# Patient Record
Sex: Female | Born: 1985
Health system: Southern US, Community
[De-identification: ages and names within clinical notes are randomized; demographics above are authoritative.]

## PROBLEM LIST (undated history)

## (undated) ENCOUNTER — Inpatient Hospital Stay (HOSPITAL_COMMUNITY): Payer: Self-pay

## (undated) DIAGNOSIS — Z8709 Personal history of other diseases of the respiratory system: Secondary | ICD-10-CM

## (undated) DIAGNOSIS — Z87440 Personal history of urinary (tract) infections: Secondary | ICD-10-CM

## (undated) DIAGNOSIS — Z789 Other specified health status: Secondary | ICD-10-CM

## (undated) DIAGNOSIS — Z87898 Personal history of other specified conditions: Secondary | ICD-10-CM

## (undated) DIAGNOSIS — Z9189 Other specified personal risk factors, not elsewhere classified: Secondary | ICD-10-CM

## (undated) DIAGNOSIS — Z8619 Personal history of other infectious and parasitic diseases: Secondary | ICD-10-CM

## (undated) DIAGNOSIS — Z8669 Personal history of other diseases of the nervous system and sense organs: Secondary | ICD-10-CM

## (undated) HISTORY — DX: Personal history of other diseases of the respiratory system: Z87.09

## (undated) HISTORY — DX: Personal history of other infectious and parasitic diseases: Z86.19

## (undated) HISTORY — DX: Personal history of other specified conditions: Z87.898

## (undated) HISTORY — DX: Personal history of other diseases of the nervous system and sense organs: Z86.69

## (undated) HISTORY — PX: NO PAST SURGERIES: SHX2092

## (undated) HISTORY — DX: Personal history of urinary (tract) infections: Z87.440

## (undated) HISTORY — DX: Other specified personal risk factors, not elsewhere classified: Z91.89

---
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
## 2005-06-19 ENCOUNTER — Ambulatory Visit: Payer: Self-pay | Admitting: Family Medicine

## 2006-06-27 ENCOUNTER — Emergency Department (HOSPITAL_COMMUNITY): Admission: EM | Admit: 2006-06-27 | Discharge: 2006-06-27 | Payer: Self-pay | Admitting: Emergency Medicine

## 2006-07-02 ENCOUNTER — Ambulatory Visit: Payer: Self-pay | Admitting: Family Medicine

## 2006-11-27 ENCOUNTER — Ambulatory Visit: Payer: Self-pay | Admitting: Family Medicine

## 2006-12-16 ENCOUNTER — Ambulatory Visit: Payer: Self-pay | Admitting: Family Medicine

## 2007-06-05 ENCOUNTER — Ambulatory Visit: Payer: Self-pay | Admitting: Internal Medicine

## 2007-06-05 LAB — CONVERTED CEMR LAB
Bilirubin Urine: NEGATIVE
Blood in Urine, dipstick: NEGATIVE
Glucose, Urine, Semiquant: NEGATIVE
Ketones, urine, test strip: NEGATIVE
Nitrite: NEGATIVE
Protein, U semiquant: NEGATIVE
Specific Gravity, Urine: 1.025
Urobilinogen, UA: NEGATIVE
WBC Urine, dipstick: NEGATIVE
pH: 6

## 2007-08-21 ENCOUNTER — Ambulatory Visit: Payer: Self-pay | Admitting: Family Medicine

## 2007-08-21 DIAGNOSIS — R109 Unspecified abdominal pain: Secondary | ICD-10-CM | POA: Insufficient documentation

## 2007-08-21 LAB — CONVERTED CEMR LAB
Bilirubin Urine: NEGATIVE
Blood in Urine, dipstick: NEGATIVE
Glucose, Urine, Semiquant: NEGATIVE
Ketones, urine, test strip: NEGATIVE
Nitrite: NEGATIVE
Protein, U semiquant: NEGATIVE
Specific Gravity, Urine: 1.01
Urobilinogen, UA: NEGATIVE
WBC Urine, dipstick: NEGATIVE
pH: 7.5

## 2009-11-21 ENCOUNTER — Telehealth: Payer: Self-pay | Admitting: Family Medicine

## 2010-10-24 ENCOUNTER — Inpatient Hospital Stay (HOSPITAL_COMMUNITY)
Admission: AD | Admit: 2010-10-24 | Discharge: 2010-10-27 | Payer: Self-pay | Source: Home / Self Care | Attending: Obstetrics and Gynecology | Admitting: Obstetrics and Gynecology

## 2010-11-28 NOTE — Progress Notes (Signed)
Summary: abdomianl pain  Phone Note Call from Patient Call back at [phone]   Caller: Patient Call For: Dr Milinda Antis Summary of Call:  Pt left v/m on triage phone. Pt has appointment to see Dr.Tower on Wed 11/23/09 at 4pm. Pt has abdominal pain especially after eats. I called pt for further details and left message on pt's v/m to call me back. Initial call taken by: Lewanda Rife LPN,  November 21, 2009 12:41 PM  Follow-up for Phone Call        I tried pt again and got v/m I left message for pt to please call back. Because of the time of day I will go ahead and send phone note to Dr. Milinda Antis.Lewanda Rife LPN  November 21, 2009 3:40 PM   keep appt with me  if severe abd pain- or high fever or dehydration after hours- go to ER Follow-up by: Judith Part MD,  November 21, 2009 4:28 PM  Additional Follow-up for Phone Call Additional follow up Details #1::        Advised pt. Additional Follow-up by: Lowella Petties CMA,  November 21, 2009 5:06 PM

## 2011-01-08 LAB — CBC
HCT: 24.6 % — ABNORMAL LOW (ref 36.0–46.0)
HCT: 33.3 % — ABNORMAL LOW (ref 36.0–46.0)
Hemoglobin: 11.7 g/dL — ABNORMAL LOW (ref 12.0–15.0)
Hemoglobin: 8.5 g/dL — ABNORMAL LOW (ref 12.0–15.0)
MCH: 29.5 pg (ref 26.0–34.0)
MCH: 29.8 pg (ref 26.0–34.0)
MCHC: 34.6 g/dL (ref 30.0–36.0)
MCHC: 35.1 g/dL (ref 30.0–36.0)
MCV: 84.9 fL (ref 78.0–100.0)
MCV: 85.4 fL (ref 78.0–100.0)
Platelets: 210 10*3/uL (ref 150–400)
Platelets: 281 10*3/uL (ref 150–400)
RBC: 2.88 MIL/uL — ABNORMAL LOW (ref 3.87–5.11)
RBC: 3.92 MIL/uL (ref 3.87–5.11)
RDW: 13.7 % (ref 11.5–15.5)
RDW: 13.9 % (ref 11.5–15.5)
WBC: 14.5 10*3/uL — ABNORMAL HIGH (ref 4.0–10.5)
WBC: 9.2 10*3/uL (ref 4.0–10.5)

## 2011-01-08 LAB — RPR: RPR Ser Ql: NONREACTIVE

## 2011-08-27 ENCOUNTER — Other Ambulatory Visit: Payer: Self-pay | Admitting: Gastroenterology

## 2011-10-30 NOTE — L&D Delivery Note (Signed)
After amniotomy the pt rapidly completed the first stage. She pushed for 30 minutes and had a SVD of one live viable white female infant over a second degree midline tear in the ROA position. Placenta-S/I. Tear closed with 3-0 chromic. Baby to NBN. EBL-400cc/

## 2012-02-04 ENCOUNTER — Encounter (HOSPITAL_COMMUNITY): Payer: Self-pay

## 2012-02-04 ENCOUNTER — Inpatient Hospital Stay (HOSPITAL_COMMUNITY)
Admission: AD | Admit: 2012-02-04 | Discharge: 2012-02-05 | Disposition: A | Discharge status: Home / Self Care | Payer: BC Managed Care – PPO | Source: Ambulatory Visit | Attending: Obstetrics and Gynecology | Admitting: Obstetrics and Gynecology

## 2012-02-04 DIAGNOSIS — O210 Mild hyperemesis gravidarum: Secondary | ICD-10-CM

## 2012-02-04 HISTORY — DX: Other specified health status: Z78.9

## 2012-02-04 LAB — URINALYSIS, ROUTINE W REFLEX MICROSCOPIC
Glucose, UA: NEGATIVE mg/dL
Hgb urine dipstick: NEGATIVE
Ketones, ur: >80 mg/dL — AB
Leukocytes, UA: NEGATIVE
Nitrite: NEGATIVE
Protein, ur: NEGATIVE mg/dL
Specific Gravity, Urine: >1.03 — ABNORMAL HIGH (ref 1.005–1.030)
Urobilinogen, UA: 1 mg/dL (ref 0.0–1.0)
pH: 6 (ref 5.0–8.0)

## 2012-02-04 LAB — POCT PREGNANCY, URINE: Preg Test, Ur: POSITIVE — AB

## 2012-02-04 LAB — COMPREHENSIVE METABOLIC PANEL
Alkaline Phosphatase: 45 U/L (ref 39–117)
BUN: 13 mg/dL (ref 6–23)
Chloride: 100 mEq/L (ref 96–112)
GFR calc Af Amer: >90 mL/min (ref >90)
Glucose, Bld: 80 mg/dL (ref 70–99)
Potassium: 3.8 mEq/L (ref 3.5–5.1)
Total Bilirubin: 0.4 mg/dL (ref 0.3–1.2)
Total Protein: 6.7 g/dL (ref 6.0–8.3)

## 2012-02-04 MED ORDER — PROMETHAZINE HCL 25 MG/ML IJ SOLN
12.5000 mg | Freq: Once | INTRAMUSCULAR | Status: AC
  Administered 2012-02-04: 12.5 mg via INTRAVENOUS
  Filled 2012-02-04: qty 1

## 2012-02-04 MED ORDER — FAMOTIDINE IN NACL 20-0.9 MG/50ML-% IV SOLN
20.0000 mg | Freq: Once | INTRAVENOUS | Status: AC
  Administered 2012-02-04: 20 mg via INTRAVENOUS
  Filled 2012-02-04: qty 50

## 2012-02-04 MED ORDER — ONDANSETRON HCL 4 MG/2ML IJ SOLN
4.0000 mg | Freq: Once | INTRAMUSCULAR | Status: AC
  Administered 2012-02-04: 4 mg via INTRAVENOUS
  Filled 2012-02-04: qty 2

## 2012-02-04 MED ORDER — THIAMINE HCL 100 MG/ML IJ SOLN
Freq: Once | INTRAVENOUS | Status: AC
  Administered 2012-02-04: 22:00:00 via INTRAVENOUS
  Filled 2012-02-04: qty 1000

## 2012-02-04 MED ORDER — LACTATED RINGERS IV BOLUS (SEPSIS)
1000.0000 mL | Freq: Once | INTRAVENOUS | Status: AC
  Administered 2012-02-04: 1000 mL via INTRAVENOUS

## 2012-02-04 NOTE — MAU Provider Note (Signed)
History     CSN: [id number]  Arrival date & time 02/04/12  1959   None     Chief Complaint  Patient presents with  . Dehydration  . Emesis During Pregnancy   HPI Bridget Walters is a 26 y.o. female @ [redacted]w[redacted]d  gestation who presents to MAU for persistent nausea and vomiting. Onset of nausea 3 weeks ago. Called the office and given instructions for diet with nausea/vomiting.  Unable to keep anything down for the past 3 days. Has been taking Reglan, Phenergan, Zofran and Vitamin B6 without relief. Weight loss of 5 pounds over the past few days. Headache and palpations. The history was provided by the patient.  No past medical history on file.  No past surgical history on file.  No family history on file.  History  Substance Use Topics  . Smoking status: Not on file  . Smokeless tobacco: Not on file  . Alcohol Use: Not on file    OB History    No data available      Review of Systems  Constitutional: Positive for chills and appetite change. Negative for fever, diaphoresis and fatigue.  HENT: Negative for ear pain, congestion, sore throat, facial swelling, neck pain, neck stiffness, dental problem and sinus pressure.   Eyes: Negative for photophobia, pain and discharge.  Respiratory: Negative for cough, chest tightness and wheezing.   Cardiovascular: Positive for palpitations.  Gastrointestinal: Positive for nausea and rectal pain. Negative for vomiting, abdominal pain (epigastric discomfort with vomiting), diarrhea, constipation and abdominal distention.  Genitourinary: Negative for dysuria, urgency, frequency, flank pain, vaginal bleeding, vaginal discharge and difficulty urinating.  Musculoskeletal: Negative for myalgias, back pain and gait problem.  Skin: Negative for color change and rash.  Neurological: Positive for dizziness, light-headedness and headaches. Negative for speech difficulty, weakness and numbness.  Psychiatric/Behavioral: Negative for confusion and agitation. The  patient is not nervous/anxious.     Allergies  Minocycline hcl and Sumatriptan  Home Medications  No current outpatient prescriptions on file.  There were no vitals taken for this visit.  Physical Exam  Nursing note and vitals reviewed. Constitutional: She is oriented to person, place, and time. She appears well-developed and well-nourished. No distress.  HENT:  Head: Normocephalic.  Eyes: EOM are normal.  Neck: Neck supple.  Pulmonary/Chest: Effort normal.  Abdominal: Soft. There is tenderness in the epigastric area.  Musculoskeletal: Normal range of motion.  Neurological: She is alert and oriented to person, place, and time. No cranial nerve deficit.  Skin: Skin is warm and dry.  Psychiatric: She has a normal mood and affect. Her behavior is normal. Judgment and thought content normal.   Results for orders placed during the hospital encounter of 02/04/12 (from the past 24 hour(s))  URINALYSIS, ROUTINE W REFLEX MICROSCOPIC     Status: Abnormal   Collection Time   02/04/12  8:20 PM      Component Value Range   Color, Urine YELLOW  YELLOW    APPearance CLEAR  CLEAR    Specific Gravity, Urine >1.030 (*) 1.005 - 1.030    pH 6.0  5.0 - 8.0    Glucose, UA NEGATIVE  NEGATIVE (mg/dL)   Hgb urine dipstick NEGATIVE  NEGATIVE    Bilirubin Urine SMALL (*) NEGATIVE    Ketones, ur >80 (*) NEGATIVE (mg/dL)   Protein, ur NEGATIVE  NEGATIVE (mg/dL)   Urobilinogen, UA 1.0  0.0 - 1.0 (mg/dL)   Nitrite NEGATIVE  NEGATIVE    Leukocytes, UA NEGATIVE  NEGATIVE   POCT PREGNANCY, URINE     Status: Abnormal   Collection Time   02/04/12  8:27 PM      Component Value Range   Preg Test, Ur POSITIVE (*) NEGATIVE   COMPREHENSIVE METABOLIC PANEL     Status: Normal   Collection Time   02/04/12  8:55 PM      Component Value Range   Sodium 135  135 - 145 (mEq/L)   Potassium 3.8  3.5 - 5.1 (mEq/L)   Chloride 100  96 - 112 (mEq/L)   CO2 23  19 - 32 (mEq/L)   Glucose, Bld 80  70 - 99 (mg/dL)   BUN 13  6  - 23 (mg/dL)   Creatinine, Ser 1.61  0.50 - 1.10 (mg/dL)   Calcium 9.6  8.4 - 09.6 (mg/dL)   Total Protein 6.7  6.0 - 8.3 (g/dL)   Albumin 3.8  3.5 - 5.2 (g/dL)   AST 12  0 - 37 (U/L)   ALT 9  0 - 35 (U/L)   Alkaline Phosphatase 45  39 - 117 (U/L)   Total Bilirubin 0.4  0.3 - 1.2 (mg/dL)   GFR calc non Af Amer >90  >90 (mL/min)   GFR calc Af Amer >90  >90 (mL/min)   Assessment: Hyperemesis gravidarum   First trimester pregnancy  Plan:  IV hydration   Zofran   Phenergan   D/c home with phenergan supp. Rx   Zofran ODT Rx   Follow up as scheduled in the office.    Informal bedside ultrasound, active IUP with cardiac activity, patient able to visualize heart beat.  ED Course: 21:20 discussed with Dr. Claiborne Billings and she agrees with plan of care.   Procedures   MDM: patient feeling much better after IV hydration and antiemetics. Taking po fluids and eating saltine crackers.

## 2012-02-04 NOTE — MAU Note (Signed)
Patient is here with c/o 9 days of n/v. She states that the left over anti-emetic that she has at home are not working. She denies any vaginal bleeding or discharge.

## 2012-02-05 MED ORDER — ONDANSETRON 8 MG PO TBDP
8.0000 mg | ORAL_TABLET | Freq: Three times a day (TID) | ORAL | Status: AC | PRN
Start: 2012-02-05 — End: 2012-02-12

## 2012-02-05 MED ORDER — PROMETHAZINE HCL 25 MG RE SUPP: 12.5000 mg | Freq: Four times a day (QID) | RECTAL | Status: DC | PRN

## 2012-02-05 NOTE — Discharge Instructions (Signed)
Hyperemesis Gravidarum Diet Hyperemesis gravidarum is a severe form of morning sickness. It is characterized by frequent and severe vomiting. It happens during the first trimester of pregnancy. It may be caused by the rapid hormone changes that happen during pregnancy. It is associated with a 5% weight loss of pre-pregnancy weight. The hyperemesis diet may be used to lessen symptoms of nausea and vomiting. EATING GUIDELINES  Eat 5 to 6 small meals daily instead of 3 large meals.   Avoid foods with strong smells.   Avoid drinking 30 minutes before and after meals.   Avoid fried or high-fat foods, such as butter and cream sauces.   Starchy foods are usually well-tolerated, such as cereal, toast, bread, potatoes, pasta, Derenzo, and pretzels.   Eat crackers before you get out of bed in the morning.   Avoid spicy foods.   Ginger may help with nausea. Add  tsp ginger to hot tea or choose ginger tea.   Continue to take your prenatal vitamins as directed by your caregiver.  SAMPLE MEAL PLAN Breakfast    cup oatmeal   1 slice toast   1 tsp heart-healthy margarine   1 tsp jelly   1 scrambled egg  Midmorning Snack   1 cup low-fat yogurt  Lunch   Plain ham sandwich   Carrot or celery sticks   1 small apple   3 graham crackers  Midafternoon Snack   Cheese and crackers  Dinner  4 oz pork tenderloin   1 small baked potato   1 tsp margarine    cup broccoli    cup grapes  Evening Snack  1 cup pudding  Document Released: 08/12/2007 Document Revised: 10/04/2011 Document Reviewed: 02/09/2011 [employer] Patient Information 2012 [employer].Hyperemesis Gravidarum Diet Hyperemesis gravidarum is a severe form of morning sickness. It is characterized by frequent and severe vomiting. It happens during the first trimester of pregnancy. It may be caused by the rapid hormone changes that happen during pregnancy. It is associated with a 5% weight loss of pre-pregnancy weight.  The hyperemesis diet may be used to lessen symptoms of nausea and vomiting. EATING GUIDELINES  Eat 5 to 6 small meals daily instead of 3 large meals.   Avoid foods with strong smells.   Avoid drinking 30 minutes before and after meals.   Avoid fried or high-fat foods, such as butter and cream sauces.   Starchy foods are usually well-tolerated, such as cereal, toast, bread, potatoes, pasta, Doster, and pretzels.   Eat crackers before you get out of bed in the morning.   Avoid spicy foods.   Ginger may help with nausea. Add  tsp ginger to hot tea or choose ginger tea.   Continue to take your prenatal vitamins as directed by your caregiver.  SAMPLE MEAL PLAN Breakfast    cup oatmeal   1 slice toast   1 tsp heart-healthy margarine   1 tsp jelly   1 scrambled egg  Midmorning Snack   1 cup low-fat yogurt  Lunch   Plain ham sandwich   Carrot or celery sticks   1 small apple   3 graham crackers  Midafternoon Snack   Cheese and crackers  Dinner  4 oz pork tenderloin   1 small baked potato   1 tsp margarine    cup broccoli    cup grapes  Evening Snack  1 cup pudding  Document Released: 08/12/2007 Document Revised: 10/04/2011 Document Reviewed: 02/09/2011 [employer] Patient Information 2012 [employer]. 

## 2012-09-01 ENCOUNTER — Encounter (HOSPITAL_COMMUNITY): Payer: Self-pay

## 2012-09-01 ENCOUNTER — Inpatient Hospital Stay (HOSPITAL_COMMUNITY)
Admission: AD | Admit: 2012-09-01 | Discharge: 2012-09-01 | Disposition: A | Discharge status: Home / Self Care | Payer: BC Managed Care – PPO | Source: Ambulatory Visit | Attending: Obstetrics and Gynecology | Admitting: Obstetrics and Gynecology

## 2012-09-01 DIAGNOSIS — O479 False labor, unspecified: Secondary | ICD-10-CM | POA: Insufficient documentation

## 2012-09-01 NOTE — MAU Note (Signed)
Contractions every 5 minutes since 7pm Sunday evening. Denies leaking of fluid or vaginal bleeding. Positive fetal movement. Was dilated 2cm in office 2 weeks ago.

## 2012-09-04 ENCOUNTER — Encounter (HOSPITAL_COMMUNITY): Payer: Self-pay | Admitting: Anesthesiology

## 2012-09-04 ENCOUNTER — Inpatient Hospital Stay (HOSPITAL_COMMUNITY): Payer: BC Managed Care – PPO | Admitting: Anesthesiology

## 2012-09-04 ENCOUNTER — Inpatient Hospital Stay (HOSPITAL_COMMUNITY)
Admission: AD | Admit: 2012-09-04 | Discharge: 2012-09-06 | DRG: 373 | Disposition: A | Discharge status: Home / Self Care | Payer: BC Managed Care – PPO | Source: Ambulatory Visit | Attending: Obstetrics and Gynecology | Admitting: Obstetrics and Gynecology

## 2012-09-04 ENCOUNTER — Encounter (HOSPITAL_COMMUNITY): Payer: Self-pay | Admitting: *Deleted

## 2012-09-04 LAB — OB RESULTS CONSOLE ABO/RH

## 2012-09-04 LAB — OB RESULTS CONSOLE HIV ANTIBODY (ROUTINE TESTING): HIV: NONREACTIVE

## 2012-09-04 LAB — CBC
MCH: 30.1 pg (ref 26.0–34.0)
MCHC: 34.7 g/dL (ref 30.0–36.0)
MCV: 86.5 fL (ref 78.0–100.0)
Platelets: 276 10*3/uL (ref 150–400)
RDW: 13.6 % (ref 11.5–15.5)

## 2012-09-04 LAB — RPR: RPR Ser Ql: NONREACTIVE

## 2012-09-04 LAB — OB RESULTS CONSOLE ANTIBODY SCREEN: Antibody Screen: NEGATIVE

## 2012-09-04 LAB — ABO/RH: ABO/RH(D): B POS

## 2012-09-04 MED ORDER — OXYCODONE-ACETAMINOPHEN 5-325 MG PO TABS
1.0000 | ORAL_TABLET | ORAL | Status: DC | PRN
  Administered 2012-09-04: 2 via ORAL
  Administered 2012-09-04: 1 via ORAL
  Filled 2012-09-04: qty 2
  Filled 2012-09-04: qty 1

## 2012-09-04 MED ORDER — ONDANSETRON HCL 4 MG/2ML IJ SOLN: 4.0000 mg | INTRAMUSCULAR | Status: DC | PRN

## 2012-09-04 MED ORDER — EPHEDRINE 5 MG/ML INJ
INTRAVENOUS | Status: AC
  Filled 2012-09-04: qty 4

## 2012-09-04 MED ORDER — DIPHENHYDRAMINE HCL 50 MG/ML IJ SOLN: 12.5000 mg | INTRAMUSCULAR | Status: DC | PRN

## 2012-09-04 MED ORDER — PENICILLIN G POTASSIUM 5000000 UNITS IJ SOLR
2.5000 10*6.[IU] | INTRAVENOUS | Status: DC
  Filled 2012-09-04: qty 2.5

## 2012-09-04 MED ORDER — ACETAMINOPHEN 325 MG PO TABS: 650.0000 mg | ORAL_TABLET | ORAL | Status: DC | PRN

## 2012-09-04 MED ORDER — ONDANSETRON HCL 4 MG PO TABS: 4.0000 mg | ORAL_TABLET | ORAL | Status: DC | PRN

## 2012-09-04 MED ORDER — TETANUS-DIPHTH-ACELL PERTUSSIS 5-2.5-18.5 LF-MCG/0.5 IM SUSP: 0.5000 mL | Freq: Once | INTRAMUSCULAR | Status: DC

## 2012-09-04 MED ORDER — SIMETHICONE 80 MG PO CHEW: 80.0000 mg | CHEWABLE_TABLET | ORAL | Status: DC | PRN

## 2012-09-04 MED ORDER — CITRIC ACID-SODIUM CITRATE 334-500 MG/5ML PO SOLN: 30.0000 mL | ORAL | Status: DC | PRN

## 2012-09-04 MED ORDER — EPHEDRINE 5 MG/ML INJ: 10.0000 mg | INTRAVENOUS | Status: DC | PRN

## 2012-09-04 MED ORDER — LACTATED RINGERS IV SOLN
500.0000 mL | Freq: Once | INTRAVENOUS | Status: AC
  Administered 2012-09-04: 500 mL via INTRAVENOUS

## 2012-09-04 MED ORDER — LIDOCAINE HCL (PF) 1 % IJ SOLN
INTRAMUSCULAR | Status: DC | PRN
  Administered 2012-09-04 (×2): 5 mL
  Administered 2012-09-04: 30 mL

## 2012-09-04 MED ORDER — PRENATAL MULTIVITAMIN CH
1.0000 | ORAL_TABLET | Freq: Every day | ORAL | Status: DC
  Administered 2012-09-04 – 2012-09-05 (×2): 1 via ORAL
  Filled 2012-09-04 (×2): qty 1

## 2012-09-04 MED ORDER — OXYTOCIN BOLUS FROM INFUSION: 500.0000 mL | INTRAVENOUS | Status: DC

## 2012-09-04 MED ORDER — OXYCODONE-ACETAMINOPHEN 5-325 MG PO TABS
1.0000 | ORAL_TABLET | ORAL | Status: DC | PRN
  Administered 2012-09-04: 2 via ORAL
  Filled 2012-09-04: qty 2

## 2012-09-04 MED ORDER — LACTATED RINGERS IV SOLN
INTRAVENOUS | Status: DC
  Administered 2012-09-04: 09:00:00 via INTRAVENOUS

## 2012-09-04 MED ORDER — FENTANYL 2.5 MCG/ML BUPIVACAINE 1/10 % EPIDURAL INFUSION (WH - ANES): 14.0000 mL/h | INTRAMUSCULAR | Status: DC

## 2012-09-04 MED ORDER — FLEET ENEMA 7-19 GM/118ML RE ENEM: 1.0000 | ENEMA | RECTAL | Status: DC | PRN

## 2012-09-04 MED ORDER — MEASLES, MUMPS & RUBELLA VAC ~~LOC~~ INJ: 0.5000 mL | INJECTION | Freq: Once | SUBCUTANEOUS | Status: DC

## 2012-09-04 MED ORDER — ONDANSETRON HCL 4 MG/2ML IJ SOLN: 4.0000 mg | Freq: Four times a day (QID) | INTRAMUSCULAR | Status: DC | PRN

## 2012-09-04 MED ORDER — PENICILLIN G POTASSIUM 5000000 UNITS IJ SOLR
5.0000 10*6.[IU] | Freq: Once | INTRAVENOUS | Status: DC
  Filled 2012-09-04: qty 5

## 2012-09-04 MED ORDER — LIDOCAINE HCL (PF) 1 % IJ SOLN
30.0000 mL | INTRAMUSCULAR | Status: DC | PRN
  Filled 2012-09-04: qty 30

## 2012-09-04 MED ORDER — BENZOCAINE-MENTHOL 20-0.5 % EX AERO
1.0000 "application " | INHALATION_SPRAY | CUTANEOUS | Status: DC | PRN
  Administered 2012-09-04: 1 via TOPICAL
  Filled 2012-09-04: qty 56

## 2012-09-04 MED ORDER — IBUPROFEN 600 MG PO TABS
600.0000 mg | ORAL_TABLET | Freq: Four times a day (QID) | ORAL | Status: DC | PRN
  Administered 2012-09-04: 600 mg via ORAL
  Filled 2012-09-04: qty 1

## 2012-09-04 MED ORDER — PHENYLEPHRINE 40 MCG/ML (10ML) SYRINGE FOR IV PUSH (FOR BLOOD PRESSURE SUPPORT): 80.0000 ug | PREFILLED_SYRINGE | INTRAVENOUS | Status: DC | PRN

## 2012-09-04 MED ORDER — FENTANYL 2.5 MCG/ML BUPIVACAINE 1/10 % EPIDURAL INFUSION (WH - ANES)
INTRAMUSCULAR | Status: AC
  Administered 2012-09-04: 14 mL/h via EPIDURAL
  Filled 2012-09-04: qty 125

## 2012-09-04 MED ORDER — OXYTOCIN 40 UNITS IN LACTATED RINGERS INFUSION - SIMPLE MED
62.5000 mL/h | INTRAVENOUS | Status: DC
  Administered 2012-09-04: 62.5 mL/h via INTRAVENOUS
  Filled 2012-09-04: qty 1000

## 2012-09-04 MED ORDER — LACTATED RINGERS IV SOLN: 500.0000 mL | INTRAVENOUS | Status: DC | PRN

## 2012-09-04 MED ORDER — PHENYLEPHRINE 40 MCG/ML (10ML) SYRINGE FOR IV PUSH (FOR BLOOD PRESSURE SUPPORT)
PREFILLED_SYRINGE | INTRAVENOUS | Status: AC
  Filled 2012-09-04: qty 5

## 2012-09-04 MED ORDER — SENNOSIDES-DOCUSATE SODIUM 8.6-50 MG PO TABS
2.0000 | ORAL_TABLET | Freq: Every evening | ORAL | Status: DC | PRN
  Administered 2012-09-04: 2 via ORAL

## 2012-09-04 MED ORDER — SODIUM CHLORIDE 0.9 % IV SOLN
2.0000 g | Freq: Once | INTRAVENOUS | Status: AC
  Administered 2012-09-04: 2 g via INTRAVENOUS
  Filled 2012-09-04: qty 2000

## 2012-09-04 MED ORDER — ZOLPIDEM TARTRATE 5 MG PO TABS
5.0000 mg | ORAL_TABLET | Freq: Every evening | ORAL | Status: DC | PRN
  Administered 2012-09-06: 5 mg via ORAL
  Filled 2012-09-04: qty 1

## 2012-09-04 MED ORDER — DIBUCAINE 1 % RE OINT: 1.0000 "application " | TOPICAL_OINTMENT | RECTAL | Status: DC | PRN

## 2012-09-04 MED ORDER — IBUPROFEN 600 MG PO TABS
600.0000 mg | ORAL_TABLET | Freq: Four times a day (QID) | ORAL | Status: DC
  Administered 2012-09-04 – 2012-09-06 (×7): 600 mg via ORAL
  Filled 2012-09-04 (×7): qty 1

## 2012-09-04 MED ORDER — WITCH HAZEL-GLYCERIN EX PADS: 1.0000 "application " | MEDICATED_PAD | CUTANEOUS | Status: DC | PRN

## 2012-09-04 NOTE — Anesthesia Preprocedure Evaluation (Signed)

## 2012-09-04 NOTE — Anesthesia Procedure Notes (Signed)
Epidural Patient location during procedure: OB Start time: 09/04/2012 9:37 AM  Staffing Anesthesiologist: Brayton Caves R Performed by: anesthesiologist   Preanesthetic Checklist Completed: patient identified, site marked, surgical consent, pre-op evaluation, timeout performed, IV checked, risks and benefits discussed and monitors and equipment checked  Epidural Patient position: sitting Prep: site prepped and draped and DuraPrep Patient monitoring: continuous pulse ox and blood pressure Approach: midline Injection technique: LOR air and LOR saline  Needle:  Needle type: Tuohy  Needle gauge: 17 G Needle length: 9 cm and 9 Needle insertion depth: 5 cm cm Catheter type: closed end flexible Catheter size: 19 Gauge Catheter at skin depth: 10 cm Test dose: negative  Assessment Events: blood not aspirated, injection not painful, no injection resistance, negative IV test and no paresthesia  Additional Notes Patient identified.  Risk benefits discussed including failed block, incomplete pain control, headache, nerve damage, paralysis, blood pressure changes, nausea, vomiting, reactions to medication both toxic or allergic, and postpartum back pain.  Patient expressed understanding and wished to proceed.  All questions were answered.  Sterile technique used throughout procedure and epidural site dressed with sterile barrier dressing. No paresthesia or other complications noted.The patient did not experience any signs of intravascular injection such as tinnitus or metallic taste in mouth nor signs of intrathecal spread such as rapid motor block. Please see nursing notes for vital signs.

## 2012-09-04 NOTE — MAU Note (Signed)
TOLD PT PLAN - TO AMB- REFUSED- SAID SHE HURT TOO  MUCH.

## 2012-09-04 NOTE — H&P (Signed)
Pt is a 26 year old white female, G2P1001 at term who presents to the ER complaining of labor. After observation the pt's cervix changed. She was then admitted. PNC was uncomplicated. PMHx: see [facility] PE: VSSAF        HENNT- wnl        ABD- gravid, non tender        Cx- 50/4-5/-1 vtx IMP/ IUP at term in labor. PLAN/ admit            Expect svd

## 2012-09-04 NOTE — Progress Notes (Signed)
Dr. Dareen Piano called as to patients request for stool softeners as she tends to have constipation. Verbal order received.

## 2012-09-04 NOTE — MAU Note (Signed)
SAYS HURT BAD SINCE 0400.   YESTERDAY- 4 CM IN OFFICE .  DENIES HSV AND MRSA.

## 2012-09-05 LAB — CBC
MCH: 29.8 pg (ref 26.0–34.0)
MCHC: 34.2 g/dL (ref 30.0–36.0)
RDW: 13.7 % (ref 11.5–15.5)

## 2012-09-05 NOTE — Progress Notes (Signed)
Post Partum Day 1 Subjective: no complaints  Objective: Blood pressure 95/60, pulse 80, temperature 98.2 F (36.8 C), temperature source Oral, resp. rate 16, height 5\' 7"  (1.702 m), weight 155 lb (70.308 kg), breastfeeding.  Physical Exam:  General: alert Lochia: appropriate Uterine Fundus: firm   Basename 09/05/12 0520 09/04/12 0835  HGB 10.3* 11.6*  HCT 30.1* 33.4*    Assessment/Plan: Plan for discharge tomorrow   LOS: 1 day   Lauralyn Shadowens D 09/05/2012, 9:47 AM

## 2012-09-05 NOTE — Anesthesia Postprocedure Evaluation (Signed)
  Anesthesia Post-op Note  Patient: Bridget Walters  Procedure(s) Performed: * No procedures listed *  Patient Location: Mother/Baby  Anesthesia Type:Epidural  Level of Consciousness: awake  Airway and Oxygen Therapy: Patient Spontanous Breathing  Post-op Pain: none  Post-op Assessment: Patient's Cardiovascular Status Stable, Respiratory Function Stable, Patent Airway, No signs of Nausea or vomiting, Adequate PO intake, Pain level controlled, No headache, No backache, No residual numbness and No residual motor weakness  Post-op Vital Signs: Reviewed and stable  Complications: No apparent anesthesia complications

## 2012-09-06 NOTE — Discharge Summary (Signed)
Obstetric Discharge Summary Reason for Admission: onset of labor Prenatal Procedures: none Intrapartum Procedures: spontaneous vaginal delivery Postpartum Procedures: none Complications-Operative and Postpartum: 2nd degree perineal laceration Hemoglobin  Date Value Range Status  09/05/2012 10.3* 12.0 - 15.0 g/dL Final     HCT  Date Value Range Status  09/05/2012 30.1* 36.0 - 46.0 % Final    Physical Exam:  General: alert Lochia: appropriate Uterine Fundus: firm  Discharge Diagnoses: Term (38 wks) Pregnancy-delivered  Discharge Information: Date: 09/06/2012 Activity: pelvic rest Diet: routine Medications: PNV and Ibuprofen Condition: stable Instructions: refer to practice specific booklet Discharge to: home Follow-up Information    Follow up with Levi Aland, MD. Schedule an appointment as soon as possible for a visit in 4 weeks.   Contact information:   [address] [place] [address] [phone]          Newborn Data: Live born female  Birth Weight: 7 lb 10.9 oz (3485 g) APGAR: 8, 9  Home with mother.  Bridget Walters D 09/06/2012, 9:12 AM

## 2012-12-13 ENCOUNTER — Other Ambulatory Visit: Payer: Self-pay

## 2013-09-03 ENCOUNTER — Other Ambulatory Visit: Payer: Self-pay

## 2014-01-19 ENCOUNTER — Ambulatory Visit (INDEPENDENT_AMBULATORY_CARE_PROVIDER_SITE_OTHER): Payer: BC Managed Care – PPO | Admitting: Family Medicine

## 2014-01-19 ENCOUNTER — Encounter: Payer: Self-pay | Admitting: Family Medicine

## 2014-01-19 VITALS — BP 116/78 | HR 71 | Temp 98.9°F | Ht 66.0 in | Wt 118.8 lb

## 2014-01-19 DIAGNOSIS — G43909 Migraine, unspecified, not intractable, without status migrainosus: Secondary | ICD-10-CM | POA: Insufficient documentation

## 2014-01-19 DIAGNOSIS — M2550 Pain in unspecified joint: Secondary | ICD-10-CM

## 2014-01-19 DIAGNOSIS — M549 Dorsalgia, unspecified: Secondary | ICD-10-CM

## 2014-01-19 DIAGNOSIS — F430 Acute stress reaction: Secondary | ICD-10-CM

## 2014-01-19 HISTORY — DX: Pain in unspecified joint: M25.50

## 2014-01-19 HISTORY — DX: Migraine, unspecified, not intractable, without status migrainosus: G43.909

## 2014-01-19 LAB — RHEUMATOID FACTOR

## 2014-01-19 MED ORDER — CYCLOBENZAPRINE HCL 10 MG PO TABS: 10.0000 mg | ORAL_TABLET | Freq: Every evening | ORAL | Status: DC | PRN

## 2014-01-19 NOTE — Progress Notes (Signed)
Subjective:    Patient ID: Bridget Walters, female    DOB: 06/27/1986, 28 y.o.   MRN: [id number]  HPI Here to re establish - from 2008   Last 4 years had 2 children   (81 year old and a 36 year old)  She stays home with them  Goes to [place] 2007 Pap/ gyn exam - was 12/13 she thinks  G2P2 She will stay with [facility] for that  She takes Errin .35 -- periods are not very regular  Also on prenatal vit   Having headaches  Woke up 3 mornings last week with one  On avg 2-3 times per week  Often starts with neck and then goes to one side of her head  Constant headache -occ throbbing  Worse with exertion or bending over  Stress precipitates that  No change with menses  Has had headaches since she was a child  No aura    [facility] Put her on low dose of antidepressant - had side effects but no improvement  She would like to stay away from medicine   No caffeine  ? If allergies -possibly Sleep habits are good  Some stress  Has a sleep number bed and has tried cervical support pillow    Also having joint pain issues  For several years  Wrists and hands and elbows - especially  Sometimes knees and legs  Entire back hurts - esp lower back (gets tight)  Very sensitive at night and cannot get good sleep  No meds Is not nursing right now  Joints do not swell   She likes to crochet-this is problematic   Last tick bite - last spring -no rash or symptoms    gmother - arthritis- suspect OA   Is active - no regular exercise   Patient Active Problem List   Diagnosis Date Noted  . ABDOMINAL PAIN, MILD 08/21/2007   Past Medical History  Diagnosis Date  . history of blood in stool   . History of chicken pox   . History of fainting   . History of headache   . History of hay fever   . History of migraine   . History of UTI   . History of urinary incontinence    Past Surgical History    Procedure Laterality Date  . No past surgeries     History  Substance Use Topics  . Smoking status: Never Smoker   . Smokeless tobacco: Not on file  . Alcohol Use: No   Family History  Problem Relation Age of Onset  . Anesthesia problems Neg Hx   . Other Neg Hx   . Diabetes Father   . Cancer Father   . Diabetes Maternal Grandmother   . Diabetes Maternal Grandfather   . Hyperlipidemia Paternal Grandmother   . Cancer Paternal Grandmother   . Arthritis Maternal Grandmother   . Lung cancer Paternal Grandmother   . Prostate cancer Paternal Grandfather   . Hypertension Father   . Kidney disease Father     had Kidney transplant   Allergies  Allergen Reactions  . Minocycline Hcl Hives  . Sumatriptan Nausea Only   Current Outpatient Prescriptions on File Prior to Visit  Medication Sig Dispense Refill  . Prenatal Vit-Fe Fumarate-FA (PRENATAL MULTIVITAMIN) TABS Take 1 tablet by mouth at bedtime.        No current facility-administered medications on file  prior to visit.     Review of Systems Review of Systems  Constitutional: Negative for fever, appetite change,  and unexpected weight change. pos for fatigue ENT neg for ST or rhinorrhea  Eyes: Negative for pain and visual disturbance.  Respiratory: Negative for cough and shortness of breath.   Cardiovascular: Negative for cp or palpitations    Gastrointestinal: Negative for nausea, diarrhea and constipation.  Genitourinary: Negative for urgency and frequency.  Skin: Negative for pallor or rash   mSK pos for joint pain without swelling  Neurological: Negative for weakness, light-headedness, numbness and pos for chronic  headaches.  Hematological: Negative for adenopathy. Does not bruise/bleed easily.  Psychiatric/Behavioral: Negative for dysphoric mood. The patient is not nervous/anxious. Pos for stressors         Objective:   Physical Exam  Constitutional: She appears well-developed and well-nourished. No distress.   HENT:  Head: Normocephalic and atraumatic.  Right Ear: External ear normal.  Left Ear: External ear normal.  Nose: Nose normal.  Mouth/Throat: Oropharynx is clear and moist.  Eyes: Conjunctivae and EOM are normal. Pupils are equal, round, and reactive to light. Right eye exhibits no discharge. Left eye exhibits no discharge. No scleral icterus.  Neck: Normal range of motion. Neck supple. No JVD present. No thyromegaly present.  Cardiovascular: Normal rate, regular rhythm, normal heart sounds and intact distal pulses.  Exam reveals no gallop.   Pulmonary/Chest: Effort normal and breath sounds normal. No respiratory distress. She has no wheezes. She has no rales.  Abdominal: Soft. Bowel sounds are normal. She exhibits no distension and no mass. There is no tenderness.  Musculoskeletal: Normal range of motion. She exhibits tenderness. She exhibits no edema.  Diffuse tenderness in wrists and elbows No swelling or crepitus in any joing  Nl rom   Lymphadenopathy:    She has no cervical adenopathy.  Neurological: She is alert. She has normal strength and normal reflexes. She displays no atrophy and no tremor. No cranial nerve deficit or sensory deficit. She exhibits normal muscle tone. Coordination and gait normal.  No cerebellar signs  Skin: Skin is warm and dry. No rash noted. No erythema. No pallor.  Psychiatric: She has a normal mood and affect.          Assessment & Plan:

## 2014-01-19 NOTE — Patient Instructions (Addendum)
Continue your prenatal vitamin Glucosamine-chondroitin - is a supplement you can get otc that may help joint pain  Some people find help from fish oil and vit D  Try the flexeril (a muscle relaxer)- to help sleep and pain at night and to prevent am headaches  Keep bed and wake times the same every day Investigate low impact exercise Drink lots of water  Quit caffeine - go to decaf in the am  If you ever want to see a counselor about stress just let me  know     Recurrent Migraine Headache A migraine headache is an intense, throbbing pain on one or both sides of your head. Recurrent migraines keep coming back. A migraine can last for 30 minutes to several hours. CAUSES  The exact cause of a migraine headache is not always known. However, a migraine may be caused when nerves in the brain become irritated and release chemicals that cause inflammation. This causes pain. Certain things may also trigger migraines, such as:   Alcohol.  Smoking.  Stress.  Menstruation.  Aged cheeses.  Foods or drinks that contain nitrates, glutamate, aspartame, or tyramine.  Lack of sleep.  Chocolate.  Caffeine.  Hunger.  Physical exertion.  Fatigue.  Medicines used to treat chest pain (nitroglycerine), birth control pills, estrogen, and some blood pressure medicines. SYMPTOMS   Pain on one or both sides of your head.  Pulsating or throbbing pain.  Severe pain that prevents daily activities.  Pain that is aggravated by any physical activity.  Nausea, vomiting, or both.  Dizziness.  Pain with exposure to bright lights, loud noises, or activity.  General sensitivity to bright lights, loud noises, or smells. Before you get a migraine, you may get warning signs that a migraine is coming (aura). An aura may include:  Seeing flashing lights.  Seeing bright spots, halos, or zig-zag lines.  Having tunnel vision or blurred vision.  Having feelings of numbness or tingling.  Having  trouble talking.  Having muscle weakness. DIAGNOSIS  A recurrent migraine headache is often diagnosed based on:  Symptoms.  Physical examination.  A CT scan or MRI of your head. These imaging tests cannot diagnose migraines, but can help rule out other causes of headaches.  TREATMENT  Medicines may be given for pain and nausea. Medicines can also be given to help prevent recurrent migraines. HOME CARE INSTRUCTIONS  Only take over-the-counter or prescription medicines for pain or discomfort as directed by your health care provider. The use of long-term narcotics is not recommended.  Lie down in a dark, quiet room when you have a migraine.  Keep a journal to find out what may trigger your migraine headaches. For example, write down:  What you eat and drink.  How much sleep you get.  Any change to your diet or medicines.  Limit alcohol consumption.  Quit smoking if you smoke.  Get 7 9 hours of sleep, or as recommended by your health care provider.  Limit stress.  Keep lights dim if bright lights bother you and make your migraines worse. SEEK MEDICAL CARE IF:   You do not get relief from the medicines given to you.  You have a recurrence of pain. SEEK IMMEDIATE MEDICAL CARE IF:  Your migraine becomes severe.  You have a fever.  You have a stiff neck.  You have loss of vision.  You have muscular weakness or loss of muscle control.  You start losing your balance or have trouble walking.  You feel  faint or pass out.  You have severe symptoms that are different from your first symptoms. MAKE SURE YOU:   Understand these instructions.  Will watch your condition.  Will get help right away if you are not doing well or get worse. Document Released: 07/10/2001 Document Revised: 08/05/2013 Document Reviewed: 06/22/2013 [facility] Patient Information 2014 [place].

## 2014-01-19 NOTE — Progress Notes (Signed)
Pre visit review using our clinic review tool, if applicable. No additional management support is needed unless otherwise documented below in the visit note. 

## 2014-01-20 LAB — CBC WITH DIFFERENTIAL/PLATELET
BASOS ABS: 0.1 10*3/uL (ref 0.0–0.1)
Basophils Relative: 0.8 % (ref 0.0–3.0)
Eosinophils Absolute: 0.1 10*3/uL (ref 0.0–0.7)
Eosinophils Relative: 1.1 % (ref 0.0–5.0)
HEMATOCRIT: 40.7 % (ref 36.0–46.0)
HEMOGLOBIN: 13.9 g/dL (ref 12.0–15.0)
LYMPHS ABS: 2.5 10*3/uL (ref 0.7–4.0)
Lymphocytes Relative: 33.8 % (ref 12.0–46.0)
MCHC: 34.2 g/dL (ref 30.0–36.0)
MCV: 87.8 fl (ref 78.0–100.0)
MONOS PCT: 4.4 % (ref 3.0–12.0)
Monocytes Absolute: 0.3 10*3/uL (ref 0.1–1.0)
NEUTROS ABS: 4.4 10*3/uL (ref 1.4–7.7)
Neutrophils Relative %: 59.9 % (ref 43.0–77.0)
Platelets: 365 10*3/uL (ref 150.0–400.0)
RBC: 4.64 Mil/uL (ref 3.87–5.11)
RDW: 12.4 % (ref 11.5–14.6)
WBC: 7.4 10*3/uL (ref 4.5–10.5)

## 2014-01-20 LAB — COMPREHENSIVE METABOLIC PANEL
ALT: 13 U/L (ref 0–35)
AST: 18 U/L (ref 0–37)
Albumin: 4.9 g/dL (ref 3.5–5.2)
Alkaline Phosphatase: 50 U/L (ref 39–117)
BILIRUBIN TOTAL: 0.5 mg/dL (ref 0.3–1.2)
BUN: 19 mg/dL (ref 6–23)
CO2: 25 meq/L (ref 19–32)
CREATININE: 1 mg/dL (ref 0.4–1.2)
Calcium: 10 mg/dL (ref 8.4–10.5)
Chloride: 104 mEq/L (ref 96–112)
GFR: 71.7 mL/min (ref >60.00)
GLUCOSE: 85 mg/dL (ref 70–99)
Potassium: 4.4 mEq/L (ref 3.5–5.1)
Sodium: 139 mEq/L (ref 135–145)
Total Protein: 7.6 g/dL (ref 6.0–8.3)

## 2014-01-20 LAB — SEDIMENTATION RATE: SED RATE: 6 mm/h (ref 0–22)

## 2014-01-20 LAB — TSH: TSH: 1.48 u[IU]/mL (ref 0.35–5.50)

## 2014-01-20 LAB — ANA: ANA: NEGATIVE

## 2014-01-20 NOTE — Assessment & Plan Note (Signed)
Reviewed stressors/ coping techniques/symptoms/ support sources/ tx options and side effects in detail today  Offered ref to counseling if she wants it  This may fuel her body pain and headaches as well

## 2014-01-20 NOTE — Assessment & Plan Note (Signed)
Likely due to scoliosis /also disc poss fibromyalgia  Disc low impact exercise

## 2014-01-20 NOTE — Assessment & Plan Note (Signed)
Pt wants to treat with lifestyle change  Rev ways to prevent headache in detail today-see AVS and handout given  Did ask her to quit caff Given flexeril to use at bedtime prn pain/ neck tightness to see if this will prevent am ha

## 2014-01-20 NOTE — Assessment & Plan Note (Signed)
No swelling/ crepitus or limited rom  Lab today for autoimmune dz  Suspect this could be fibromyalgia- disc this is a dx of exclusion Recommended Integretive therapies in [place]- to investigate accupuncture and exercise as well >25 minutes spent in face to face time with patient, >50% spent in counselling or coordination of care

## 2014-05-21 ENCOUNTER — Encounter: Payer: Self-pay | Admitting: Internal Medicine

## 2014-05-21 ENCOUNTER — Ambulatory Visit (INDEPENDENT_AMBULATORY_CARE_PROVIDER_SITE_OTHER): Payer: BC Managed Care – PPO | Admitting: Internal Medicine

## 2014-05-21 VITALS — BP 100/60 | HR 90 | Temp 98.6°F | Wt 116.0 lb

## 2014-05-21 DIAGNOSIS — J039 Acute tonsillitis, unspecified: Secondary | ICD-10-CM

## 2014-05-21 LAB — POCT RAPID STREP A (OFFICE): Rapid Strep A Screen: NEGATIVE

## 2014-05-21 NOTE — Progress Notes (Signed)
Pre visit review using our clinic review tool, if applicable. No additional management support is needed unless otherwise documented below in the visit note. 

## 2014-05-21 NOTE — Assessment & Plan Note (Signed)
Low probability for strep and rapid test negative I have seen other viral type syndromes with sore throat this week--discussed Supportive Rx

## 2014-05-21 NOTE — Progress Notes (Signed)
   Subjective:    Patient ID: Bridget Walters, female    DOB: Jun 03, 1986, 28 y.o.   MRN: [id number]  HPI Has had a sore throat and body aches since yesterday Tonsils swollen with white patches Some cold chills and sweating No fever  No cough Feels a little lightheaded and SOB No ear pain Slight headache---relates to the overall aching  Tried advil and nyquil--??any help Didn't sleep well Today she is a little better  Current Outpatient Prescriptions on File Prior to Visit  Medication Sig Dispense Refill  . cyclobenzaprine (FLEXERIL) 10 MG tablet Take 1 tablet (10 mg total) by mouth at bedtime as needed for muscle spasms.  30 tablet  3  . ERRIN 0.35 MG tablet Take 1 tablet by mouth daily.      . Prenatal Vit-Fe Fumarate-FA (PRENATAL MULTIVITAMIN) TABS Take 1 tablet by mouth at bedtime.        No current facility-administered medications on file prior to visit.    Allergies  Allergen Reactions  . Minocycline Hcl Hives  . Sumatriptan Nausea Only    Past Medical History  Diagnosis Date  . history of blood in stool   . History of chicken pox   . History of fainting   . History of headache   . History of hay fever   . History of migraine   . History of UTI   . History of urinary incontinence     Past Surgical History  Procedure Laterality Date  . No past surgeries      Family History  Problem Relation Age of Onset  . Anesthesia problems Neg Hx   . Other Neg Hx   . Diabetes Father   . Cancer Father   . Diabetes Maternal Grandmother   . Diabetes Maternal Grandfather   . Hyperlipidemia Paternal Grandmother   . Cancer Paternal Grandmother   . Arthritis Maternal Grandmother   . Lung cancer Paternal Grandmother   . Prostate cancer Paternal Grandfather   . Hypertension Father   . Kidney disease Father     had Kidney transplant    History   Social History  . Marital Status: Married    Spouse Name: N/A    Number of Children: N/A  . Years of Education:  N/A   Occupational History  . Not on file.   Social History Main Topics  . Smoking status: Never Smoker   . Smokeless tobacco: Not on file  . Alcohol Use: No  . Drug Use: No  . Sexual Activity: Yes   Other Topics Concern  . Not on file   Social History Narrative  . No narrative on file   Review of Systems No rash No ill exposures-- sons are not sick No vomiting or diarrhea Appetite is okay    Objective:   Physical Exam  Constitutional: She appears well-developed and well-nourished. No distress.  HENT:  No sinus tenderness TMs normal Slight nasal congestion Tonsils 1+ with slight exudate. Mild inflammation  Neck: Normal range of motion. Neck supple.  Slight non tender anterior cervical adenopathy  Pulmonary/Chest: Effort normal and breath sounds normal. No respiratory distress. She has no wheezes. She has no rales.  Skin: No rash noted.          Assessment & Plan:

## 2014-07-19 ENCOUNTER — Encounter: Payer: Self-pay | Admitting: Family Medicine

## 2014-07-19 ENCOUNTER — Ambulatory Visit (INDEPENDENT_AMBULATORY_CARE_PROVIDER_SITE_OTHER): Payer: BC Managed Care – PPO | Admitting: Family Medicine

## 2014-07-19 VITALS — BP 98/54 | HR 87 | Temp 99.0°F | Ht 66.0 in | Wt 119.5 lb

## 2014-07-19 DIAGNOSIS — N63 Unspecified lump in unspecified breast: Secondary | ICD-10-CM

## 2014-07-19 DIAGNOSIS — N6310 Unspecified lump in the right breast, unspecified quadrant: Secondary | ICD-10-CM

## 2014-07-19 NOTE — Assessment & Plan Note (Signed)
Small and mobile -suspect fibrocystic change or small cyst  Will ref for bilat dx mammogram and unil [place] of R breast  Then update

## 2014-07-19 NOTE — Patient Instructions (Signed)
Stop at check out for referral for mammogram and ultrasound  Keep doing self breast exams  We will contact you with results

## 2014-07-19 NOTE — Progress Notes (Signed)
Subjective:    Patient ID: Bridget Walters, female    DOB: 26-Jun-1986, 28 y.o.   MRN: [id number]  HPI Here for a lump in R breast laterally  Size of a jelly bean  Is tender and bothersome  No redness  There for a month   Periods are not always regular Last one just over a month ago  On progesterone only pill   Patient Active Problem List   Diagnosis Date Noted  . Acute tonsillitis 05/21/2014  . Pain, joint, multiple sites 01/19/2014  . Back pain 01/19/2014  . Migraine headache 01/19/2014  . Stress reaction 01/19/2014   Past Medical History  Diagnosis Date  . history of blood in stool   . History of chicken pox   . History of fainting   . History of headache   . History of hay fever   . History of migraine   . History of UTI   . History of urinary incontinence    Past Surgical History  Procedure Laterality Date  . No past surgeries     History  Substance Use Topics  . Smoking status: Never Smoker   . Smokeless tobacco: Not on file  . Alcohol Use: No   Family History  Problem Relation Age of Onset  . Anesthesia problems Neg Hx   . Other Neg Hx   . Diabetes Father   . Cancer Father   . Diabetes Maternal Grandmother   . Diabetes Maternal Grandfather   . Hyperlipidemia Paternal Grandmother   . Cancer Paternal Grandmother   . Arthritis Maternal Grandmother   . Lung cancer Paternal Grandmother   . Prostate cancer Paternal Grandfather   . Hypertension Father   . Kidney disease Father     had Kidney transplant   Allergies  Allergen Reactions  . Minocycline Hcl Hives  . Sumatriptan Nausea Only   Current Outpatient Prescriptions on File Prior to Visit  Medication Sig Dispense Refill  . ERRIN 0.35 MG tablet Take 1 tablet by mouth daily.      . Prenatal Vit-Fe Fumarate-FA (PRENATAL MULTIVITAMIN) TABS Take 1 tablet by mouth at bedtime.        No current facility-administered medications on file prior to visit.     Review of Systems Review of  Systems  Constitutional: Negative for fever, appetite change, fatigue and unexpected weight change.  Eyes: Negative for pain and visual disturbance.  Respiratory: Negative for cough and shortness of breath.   Cardiovascular: Negative for cp or palpitations    Gastrointestinal: Negative for nausea, diarrhea and constipation.  Genitourinary: Negative for urgency and frequency.  Skin: Negative for pallor or rash   Neurological: Negative for weakness, light-headedness, numbness and headaches.  Hematological: Negative for adenopathy. Does not bruise/bleed easily.  Psychiatric/Behavioral: Negative for dysphoric mood. The patient is not nervous/anxious.         Objective:   Physical Exam  Constitutional: She appears well-developed and well-nourished. No distress.  Slim and well appearing   HENT:  Head: Normocephalic and atraumatic.  Eyes: Conjunctivae and EOM are normal. Pupils are equal, round, and reactive to light.  Neck: Normal range of motion. Neck supple.  Cardiovascular: Normal rate, regular rhythm and normal heart sounds.   Pulmonary/Chest: Effort normal and breath sounds normal.  Genitourinary: No breast swelling, tenderness, discharge or bleeding.  Small .5 cm lump felt in R breast at 9:00 that is mobile and nt  No skin change or nipple discharge  Nl L breast exam  Dense breasts bilaterally  Lymphadenopathy:    She has no cervical adenopathy.  Neurological: She is alert.  Skin: No rash noted. No erythema.  Psychiatric: She has a normal mood and affect.          Assessment & Plan:   Problem List Items Addressed This Visit     Other   Lump of right breast - Primary     Small and mobile -suspect fibrocystic change or small cyst  Will ref for bilat dx mammogram and unil [place] of R breast  Then update     Relevant Orders      MM Digital Diagnostic Bilat      US BREAST LTD UNI RIGHT INC AXILLA

## 2014-07-19 NOTE — Progress Notes (Signed)
Pre visit review using our clinic review tool, if applicable. No additional management support is needed unless otherwise documented below in the visit note. 

## 2014-07-20 ENCOUNTER — Ambulatory Visit: Payer: Self-pay | Admitting: Family Medicine

## 2014-07-23 ENCOUNTER — Encounter: Payer: Self-pay | Admitting: Family Medicine

## 2014-08-25 ENCOUNTER — Telehealth: Payer: Self-pay | Admitting: Family Medicine

## 2014-08-25 NOTE — Telephone Encounter (Signed)
Called pt regarding her inquiry to her $1200 bill from [facility].  Explained that even if we coded a MM and [place] of breast as routine, if [facility] has any findings or patient reports pain, then a diagnostic MM and diagnostic US code would be used for the procedure.  In her case, [employer] has applied the amount to her deductible.  She understood and stated that she was told by her insurance that she could appeal the decision.

## 2014-08-30 ENCOUNTER — Encounter: Payer: Self-pay | Admitting: Family Medicine

## 2014-09-21 ENCOUNTER — Telehealth: Payer: Self-pay | Admitting: Family Medicine

## 2014-09-22 NOTE — Telephone Encounter (Signed)
Opened in error

## 2014-12-02 ENCOUNTER — Ambulatory Visit (INDEPENDENT_AMBULATORY_CARE_PROVIDER_SITE_OTHER): Payer: BLUE CROSS/BLUE SHIELD | Admitting: Obstetrics and Gynecology

## 2014-12-02 ENCOUNTER — Encounter: Payer: Self-pay | Admitting: Obstetrics and Gynecology

## 2014-12-02 VITALS — BP 132/80 | HR 92 | Ht 67.0 in | Wt 117.0 lb

## 2014-12-02 DIAGNOSIS — Z1151 Encounter for screening for human papillomavirus (HPV): Secondary | ICD-10-CM

## 2014-12-02 DIAGNOSIS — Z01419 Encounter for gynecological examination (general) (routine) without abnormal findings: Secondary | ICD-10-CM | POA: Diagnosis not present

## 2014-12-02 DIAGNOSIS — Z124 Encounter for screening for malignant neoplasm of cervix: Secondary | ICD-10-CM

## 2014-12-02 NOTE — Patient Instructions (Signed)
Preventive Care for Adults A healthy lifestyle and preventive care can promote health and wellness. Preventive health guidelines for women include the following key practices.  A routine yearly physical is a good way to check with your health care provider about your health and preventive screening. It is a chance to share any concerns and updates on your health and to receive a thorough exam.  Visit your dentist for a routine exam and preventive care every 6 months. Brush your teeth twice a day and floss once a day. Good oral hygiene prevents tooth decay and gum disease.  The frequency of eye exams is based on your age, health, family medical history, use of contact lenses, and other factors. Follow your health care provider's recommendations for frequency of eye exams.  Eat a healthy diet. Foods like vegetables, fruits, whole grains, low-fat dairy products, and lean protein foods contain the nutrients you need without too many calories. Decrease your intake of foods high in solid fats, added sugars, and salt. Eat the right amount of calories for you.Get information about a proper diet from your health care provider, if necessary.  Regular physical exercise is one of the most important things you can do for your health. Most adults should get at least 150 minutes of moderate-intensity exercise (any activity that increases your heart rate and causes you to sweat) each week. In addition, most adults need muscle-strengthening exercises on 2 or more days a week.  Maintain a healthy weight. The body mass index (BMI) is a screening tool to identify possible weight problems. It provides an estimate of body fat based on height and weight. Your health care provider can find your BMI and can help you achieve or maintain a healthy weight.For adults 20 years and older:  A BMI below 18.5 is considered underweight.  A BMI of 18.5 to 24.9 is normal.  A BMI of 25 to 29.9 is considered overweight.  A BMI of  30 and above is considered obese.  Maintain normal blood lipids and cholesterol levels by exercising and minimizing your intake of saturated fat. Eat a balanced diet with plenty of fruit and vegetables. Blood tests for lipids and cholesterol should begin at age 20 and be repeated every 5 years. If your lipid or cholesterol levels are high, you are over 50, or you are at high risk for heart disease, you may need your cholesterol levels checked more frequently.Ongoing high lipid and cholesterol levels should be treated with medicines if diet and exercise are not working.  If you smoke, find out from your health care provider how to quit. If you do not use tobacco, do not start.  Lung cancer screening is recommended for adults aged 55-80 years who are at high risk for developing lung cancer because of a history of smoking. A yearly low-dose CT scan of the lungs is recommended for people who have at least a 30-pack-year history of smoking and are a current smoker or have quit within the past 15 years. A pack year of smoking is smoking an average of 1 pack of cigarettes a day for 1 year (for example: 1 pack a day for 30 years or 2 packs a day for 15 years). Yearly screening should continue until the smoker has stopped smoking for at least 15 years. Yearly screening should be stopped for people who develop a health problem that would prevent them from having lung cancer treatment.  If you are pregnant, do not drink alcohol. If you are breastfeeding,   be very cautious about drinking alcohol. If you are not pregnant and choose to drink alcohol, do not have more than 1 drink per day. One drink is considered to be 12 ounces (355 mL) of beer, 5 ounces (148 mL) of wine, or 1.5 ounces (44 mL) of liquor.  Avoid use of street drugs. Do not share needles with anyone. Ask for help if you need support or instructions about stopping the use of drugs.  High blood pressure causes heart disease and increases the risk of  stroke. Your blood pressure should be checked at least every 1 to 2 years. Ongoing high blood pressure should be treated with medicines if weight loss and exercise do not work.  If you are 75-52 years old, ask your health care provider if you should take aspirin to prevent strokes.  Diabetes screening involves taking a blood sample to check your fasting blood sugar level. This should be done once every 3 years, after age 15, if you are within normal weight and without risk factors for diabetes. Testing should be considered at a younger age or be carried out more frequently if you are overweight and have at least 1 risk factor for diabetes.  Breast cancer screening is essential preventive care for women. You should practice "breast self-awareness." This means understanding the normal appearance and feel of your breasts and may include breast self-examination. Any changes detected, no matter how small, should be reported to a health care provider. Women in their 58s and 30s should have a clinical breast exam (CBE) by a health care provider as part of a regular health exam every 1 to 3 years. After age 16, women should have a CBE every year. Starting at age 53, women should consider having a mammogram (breast X-ray test) every year. Women who have a family history of breast cancer should talk to their health care provider about genetic screening. Women at a high risk of breast cancer should talk to their health care providers about having an MRI and a mammogram every year.  Breast cancer gene (BRCA)-related cancer risk assessment is recommended for women who have family members with BRCA-related cancers. BRCA-related cancers include breast, ovarian, tubal, and peritoneal cancers. Having family members with these cancers may be associated with an increased risk for harmful changes (mutations) in the breast cancer genes BRCA1 and BRCA2. Results of the assessment will determine the need for genetic counseling and  BRCA1 and BRCA2 testing.  Routine pelvic exams to screen for cancer are no longer recommended for nonpregnant women who are considered low risk for cancer of the pelvic organs (ovaries, uterus, and vagina) and who do not have symptoms. Ask your health care provider if a screening pelvic exam is right for you.  If you have had past treatment for cervical cancer or a condition that could lead to cancer, you need Pap tests and screening for cancer for at least 20 years after your treatment. If Pap tests have been discontinued, your risk factors (such as having a new sexual partner) need to be reassessed to determine if screening should be resumed. Some women have medical problems that increase the chance of getting cervical cancer. In these cases, your health care provider may recommend more frequent screening and Pap tests.  The HPV test is an additional test that may be used for cervical cancer screening. The HPV test looks for the virus that can cause the cell changes on the cervix. The cells collected during the Pap test can be  tested for HPV. The HPV test could be used to screen women aged 30 years and older, and should be used in women of any age who have unclear Pap test results. After the age of 30, women should have HPV testing at the same frequency as a Pap test.  Colorectal cancer can be detected and often prevented. Most routine colorectal cancer screening begins at the age of 50 years and continues through age 75 years. However, your health care provider may recommend screening at an earlier age if you have risk factors for colon cancer. On a yearly basis, your health care provider may provide home test kits to check for hidden blood in the stool. Use of a small camera at the end of a tube, to directly examine the colon (sigmoidoscopy or colonoscopy), can detect the earliest forms of colorectal cancer. Talk to your health care provider about this at age 50, when routine screening begins. Direct  exam of the colon should be repeated every 5-10 years through age 75 years, unless early forms of pre-cancerous polyps or small growths are found.  People who are at an increased risk for hepatitis B should be screened for this virus. You are considered at high risk for hepatitis B if:  You were born in a country where hepatitis B occurs often. Talk with your health care provider about which countries are considered high risk.  Your parents were born in a high-risk country and you have not received a shot to protect against hepatitis B (hepatitis B vaccine).  You have HIV or AIDS.  You use needles to inject street drugs.  You live with, or have sex with, someone who has hepatitis B.  You get hemodialysis treatment.  You take certain medicines for conditions like cancer, organ transplantation, and autoimmune conditions.  Hepatitis C blood testing is recommended for all people born from 1945 through 1965 and any individual with known risks for hepatitis C.  Practice safe sex. Use condoms and avoid high-risk sexual practices to reduce the spread of sexually transmitted infections (STIs). STIs include gonorrhea, chlamydia, syphilis, trichomonas, herpes, HPV, and human immunodeficiency virus (HIV). Herpes, HIV, and HPV are viral illnesses that have no cure. They can result in disability, cancer, and death.  You should be screened for sexually transmitted illnesses (STIs) including gonorrhea and chlamydia if:  You are sexually active and are younger than 24 years.  You are older than 24 years and your health care provider tells you that you are at risk for this type of infection.  Your sexual activity has changed since you were last screened and you are at an increased risk for chlamydia or gonorrhea. Ask your health care provider if you are at risk.  If you are at risk of being infected with HIV, it is recommended that you take a prescription medicine daily to prevent HIV infection. This is  called preexposure prophylaxis (PrEP). You are considered at risk if:  You are a heterosexual woman, are sexually active, and are at increased risk for HIV infection.  You take drugs by injection.  You are sexually active with a partner who has HIV.  Talk with your health care provider about whether you are at high risk of being infected with HIV. If you choose to begin PrEP, you should first be tested for HIV. You should then be tested every 3 months for as long as you are taking PrEP.  Osteoporosis is a disease in which the bones lose minerals and strength   with aging. This can result in serious bone fractures or breaks. The risk of osteoporosis can be identified using a bone density scan. Women ages 65 years and over and women at risk for fractures or osteoporosis should discuss screening with their health care providers. Ask your health care provider whether you should take a calcium supplement or vitamin D to reduce the rate of osteoporosis.  Menopause can be associated with physical symptoms and risks. Hormone replacement therapy is available to decrease symptoms and risks. You should talk to your health care provider about whether hormone replacement therapy is right for you.  Use sunscreen. Apply sunscreen liberally and repeatedly throughout the day. You should seek shade when your shadow is shorter than you. Protect yourself by wearing long sleeves, pants, a wide-brimmed hat, and sunglasses year round, whenever you are outdoors.  Once a month, do a whole body skin exam, using a mirror to look at the skin on your back. Tell your health care provider of new moles, moles that have irregular borders, moles that are larger than a pencil eraser, or moles that have changed in shape or color.  Stay current with required vaccines (immunizations).  Influenza vaccine. All adults should be immunized every year.  Tetanus, diphtheria, and acellular pertussis (Td, Tdap) vaccine. Pregnant women should  receive 1 dose of Tdap vaccine during each pregnancy. The dose should be obtained regardless of the length of time since the last dose. Immunization is preferred during the 27th-36th week of gestation. An adult who has not previously received Tdap or who does not know her vaccine status should receive 1 dose of Tdap. This initial dose should be followed by tetanus and diphtheria toxoids (Td) booster doses every 10 years. Adults with an unknown or incomplete history of completing a 3-dose immunization series with Td-containing vaccines should begin or complete a primary immunization series including a Tdap dose. Adults should receive a Td booster every 10 years.  Varicella vaccine. An adult without evidence of immunity to varicella should receive 2 doses or a second dose if she has previously received 1 dose. Pregnant females who do not have evidence of immunity should receive the first dose after pregnancy. This first dose should be obtained before leaving the health care facility. The second dose should be obtained 4-8 weeks after the first dose.  Human papillomavirus (HPV) vaccine. Females aged 13-26 years who have not received the vaccine previously should obtain the 3-dose series. The vaccine is not recommended for use in pregnant females. However, pregnancy testing is not needed before receiving a dose. If a female is found to be pregnant after receiving a dose, no treatment is needed. In that case, the remaining doses should be delayed until after the pregnancy. Immunization is recommended for any person with an immunocompromised condition through the age of 26 years if she did not get any or all doses earlier. During the 3-dose series, the second dose should be obtained 4-8 weeks after the first dose. The third dose should be obtained 24 weeks after the first dose and 16 weeks after the second dose.  Zoster vaccine. One dose is recommended for adults aged 60 years or older unless certain conditions are  present.  Measles, mumps, and rubella (MMR) vaccine. Adults born before 1957 generally are considered immune to measles and mumps. Adults born in 1957 or later should have 1 or more doses of MMR vaccine unless there is a contraindication to the vaccine or there is laboratory evidence of immunity to   each of the three diseases. A routine second dose of MMR vaccine should be obtained at least 28 days after the first dose for students attending postsecondary schools, health care workers, or international travelers. People who received inactivated measles vaccine or an unknown type of measles vaccine during 1963-1967 should receive 2 doses of MMR vaccine. People who received inactivated mumps vaccine or an unknown type of mumps vaccine before 1979 and are at high risk for mumps infection should consider immunization with 2 doses of MMR vaccine. For females of childbearing age, rubella immunity should be determined. If there is no evidence of immunity, females who are not pregnant should be vaccinated. If there is no evidence of immunity, females who are pregnant should delay immunization until after pregnancy. Unvaccinated health care workers born before 1957 who lack laboratory evidence of measles, mumps, or rubella immunity or laboratory confirmation of disease should consider measles and mumps immunization with 2 doses of MMR vaccine or rubella immunization with 1 dose of MMR vaccine.  Pneumococcal 13-valent conjugate (PCV13) vaccine. When indicated, a person who is uncertain of her immunization history and has no record of immunization should receive the PCV13 vaccine. An adult aged 19 years or older who has certain medical conditions and has not been previously immunized should receive 1 dose of PCV13 vaccine. This PCV13 should be followed with a dose of pneumococcal polysaccharide (PPSV23) vaccine. The PPSV23 vaccine dose should be obtained at least 8 weeks after the dose of PCV13 vaccine. An adult aged 19  years or older who has certain medical conditions and previously received 1 or more doses of PPSV23 vaccine should receive 1 dose of PCV13. The PCV13 vaccine dose should be obtained 1 or more years after the last PPSV23 vaccine dose.  Pneumococcal polysaccharide (PPSV23) vaccine. When PCV13 is also indicated, PCV13 should be obtained first. All adults aged 65 years and older should be immunized. An adult younger than age 65 years who has certain medical conditions should be immunized. Any person who resides in a nursing home or long-term care facility should be immunized. An adult smoker should be immunized. People with an immunocompromised condition and certain other conditions should receive both PCV13 and PPSV23 vaccines. People with human immunodeficiency virus (HIV) infection should be immunized as soon as possible after diagnosis. Immunization during chemotherapy or radiation therapy should be avoided. Routine use of PPSV23 vaccine is not recommended for American Indians, [place] Natives, or people younger than 65 years unless there are medical conditions that require PPSV23 vaccine. When indicated, people who have unknown immunization and have no record of immunization should receive PPSV23 vaccine. One-time revaccination 5 years after the first dose of PPSV23 is recommended for people aged 19-64 years who have chronic kidney failure, nephrotic syndrome, asplenia, or immunocompromised conditions. People who received 1-2 doses of PPSV23 before age 65 years should receive another dose of PPSV23 vaccine at age 65 years or later if at least 5 years have passed since the previous dose. Doses of PPSV23 are not needed for people immunized with PPSV23 at or after age 65 years.  Meningococcal vaccine. Adults with asplenia or persistent complement component deficiencies should receive 2 doses of quadrivalent meningococcal conjugate (MenACWY-D) vaccine. The doses should be obtained at least 2 months apart.  Microbiologists working with certain meningococcal bacteria, military recruits, people at risk during an outbreak, and people who travel to or live in countries with a high rate of meningitis should be immunized. A first-year college student up through age   21 years who is living in a residence hall should receive a dose if she did not receive a dose on or after her 16th birthday. Adults who have certain high-risk conditions should receive one or more doses of vaccine.  Hepatitis A vaccine. Adults who wish to be protected from this disease, have certain high-risk conditions, work with hepatitis A-infected animals, work in hepatitis A research labs, or travel to or work in countries with a high rate of hepatitis A should be immunized. Adults who were previously unvaccinated and who anticipate close contact with an international adoptee during the first 60 days after arrival in the [place] from a country with a high rate of hepatitis A should be immunized.  Hepatitis B vaccine. Adults who wish to be protected from this disease, have certain high-risk conditions, may be exposed to blood or other infectious body fluids, are household contacts or sex partners of hepatitis B positive people, are clients or workers in certain care facilities, or travel to or work in countries with a high rate of hepatitis B should be immunized.  Haemophilus influenzae type b (Hib) vaccine. A previously unvaccinated person with asplenia or sickle cell disease or having a scheduled splenectomy should receive 1 dose of Hib vaccine. Regardless of previous immunization, a recipient of a hematopoietic stem cell transplant should receive a 3-dose series 6-12 months after her successful transplant. Hib vaccine is not recommended for adults with HIV infection. Preventive Services / Frequency Ages 64 to 68 years  Blood pressure check.** / Every 1 to 2 years.  Lipid and cholesterol check.** / Every 5 years beginning at age  22.  Clinical breast exam.** / Every 3 years for women in their 88s and 53s.  BRCA-related cancer risk assessment.** / For women who have family members with a BRCA-related cancer (breast, ovarian, tubal, or peritoneal cancers).  Pap test.** / Every 2 years from ages 90 through 51. Every 3 years starting at age 21 through age 56 or 3 with a history of 3 consecutive normal Pap tests.  HPV screening.** / Every 3 years from ages 24 through ages 1 to 46 with a history of 3 consecutive normal Pap tests.  Hepatitis C blood test.** / For any individual with known risks for hepatitis C.  Skin self-exam. / Monthly.  Influenza vaccine. / Every year.  Tetanus, diphtheria, and acellular pertussis (Tdap, Td) vaccine.** / Consult your health care provider. Pregnant women should receive 1 dose of Tdap vaccine during each pregnancy. 1 dose of Td every 10 years.  Varicella vaccine.** / Consult your health care provider. Pregnant females who do not have evidence of immunity should receive the first dose after pregnancy.  HPV vaccine. / 3 doses over 6 months, if 72 and younger. The vaccine is not recommended for use in pregnant females. However, pregnancy testing is not needed before receiving a dose.  Measles, mumps, rubella (MMR) vaccine.** / You need at least 1 dose of MMR if you were born in 1957 or later. You may also need a 2nd dose. For females of childbearing age, rubella immunity should be determined. If there is no evidence of immunity, females who are not pregnant should be vaccinated. If there is no evidence of immunity, females who are pregnant should delay immunization until after pregnancy.  Pneumococcal 13-valent conjugate (PCV13) vaccine.** / Consult your health care provider.  Pneumococcal polysaccharide (PPSV23) vaccine.** / 1 to 2 doses if you smoke cigarettes or if you have certain conditions.  Meningococcal vaccine.** /  1 dose if you are age 19 to 21 years and a first-year college  student living in a residence hall, or have one of several medical conditions, you need to get vaccinated against meningococcal disease. You may also need additional booster doses.  Hepatitis A vaccine.** / Consult your health care provider.  Hepatitis B vaccine.** / Consult your health care provider.  Haemophilus influenzae type b (Hib) vaccine.** / Consult your health care provider. Ages 40 to 64 years  Blood pressure check.** / Every 1 to 2 years.  Lipid and cholesterol check.** / Every 5 years beginning at age 20 years.  Lung cancer screening. / Every year if you are aged 55-80 years and have a 30-pack-year history of smoking and currently smoke or have quit within the past 15 years. Yearly screening is stopped once you have quit smoking for at least 15 years or develop a health problem that would prevent you from having lung cancer treatment.  Clinical breast exam.** / Every year after age 40 years.  BRCA-related cancer risk assessment.** / For women who have family members with a BRCA-related cancer (breast, ovarian, tubal, or peritoneal cancers).  Mammogram.** / Every year beginning at age 40 years and continuing for as long as you are in good health. Consult with your health care provider.  Pap test.** / Every 3 years starting at age 30 years through age 65 or 70 years with a history of 3 consecutive normal Pap tests.  HPV screening.** / Every 3 years from ages 30 years through ages 65 to 70 years with a history of 3 consecutive normal Pap tests.  Fecal occult blood test (FOBT) of stool. / Every year beginning at age 50 years and continuing until age 75 years. You may not need to do this test if you get a colonoscopy every 10 years.  Flexible sigmoidoscopy or colonoscopy.** / Every 5 years for a flexible sigmoidoscopy or every 10 years for a colonoscopy beginning at age 50 years and continuing until age 75 years.  Hepatitis C blood test.** / For all people born from 1945 through  1965 and any individual with known risks for hepatitis C.  Skin self-exam. / Monthly.  Influenza vaccine. / Every year.  Tetanus, diphtheria, and acellular pertussis (Tdap/Td) vaccine.** / Consult your health care provider. Pregnant women should receive 1 dose of Tdap vaccine during each pregnancy. 1 dose of Td every 10 years.  Varicella vaccine.** / Consult your health care provider. Pregnant females who do not have evidence of immunity should receive the first dose after pregnancy.  Zoster vaccine.** / 1 dose for adults aged 60 years or older.  Measles, mumps, rubella (MMR) vaccine.** / You need at least 1 dose of MMR if you were born in 1957 or later. You may also need a 2nd dose. For females of childbearing age, rubella immunity should be determined. If there is no evidence of immunity, females who are not pregnant should be vaccinated. If there is no evidence of immunity, females who are pregnant should delay immunization until after pregnancy.  Pneumococcal 13-valent conjugate (PCV13) vaccine.** / Consult your health care provider.  Pneumococcal polysaccharide (PPSV23) vaccine.** / 1 to 2 doses if you smoke cigarettes or if you have certain conditions.  Meningococcal vaccine.** / Consult your health care provider.  Hepatitis A vaccine.** / Consult your health care provider.  Hepatitis B vaccine.** / Consult your health care provider.  Haemophilus influenzae type b (Hib) vaccine.** / Consult your health care provider. Ages 65   years and over  Blood pressure check.** / Every 1 to 2 years.  Lipid and cholesterol check.** / Every 5 years beginning at age 1 years.  Lung cancer screening. / Every year if you are aged 52-80 years and have a 30-pack-year history of smoking and currently smoke or have quit within the past 15 years. Yearly screening is stopped once you have quit smoking for at least 15 years or develop a health problem that would prevent you from having lung cancer  treatment.  Clinical breast exam.** / Every year after age 59 years.  BRCA-related cancer risk assessment.** / For women who have family members with a BRCA-related cancer (breast, ovarian, tubal, or peritoneal cancers).  Mammogram.** / Every year beginning at age 40 years and continuing for as long as you are in good health. Consult with your health care provider.  Pap test.** / Every 3 years starting at age 86 years through age 70 or 35 years with 3 consecutive normal Pap tests. Testing can be stopped between 65 and 70 years with 3 consecutive normal Pap tests and no abnormal Pap or HPV tests in the past 10 years.  HPV screening.** / Every 3 years from ages 9 years through ages 60 or 58 years with a history of 3 consecutive normal Pap tests. Testing can be stopped between 65 and 70 years with 3 consecutive normal Pap tests and no abnormal Pap or HPV tests in the past 10 years.  Fecal occult blood test (FOBT) of stool. / Every year beginning at age 37 years and continuing until age 82 years. You may not need to do this test if you get a colonoscopy every 10 years.  Flexible sigmoidoscopy or colonoscopy.** / Every 5 years for a flexible sigmoidoscopy or every 10 years for a colonoscopy beginning at age 43 years and continuing until age 93 years.  Hepatitis C blood test.** / For all people born from [birth year] through 1967 and any individual with known risks for hepatitis C.  Osteoporosis screening.** / A one-time screening for women ages 47 years and over and women at risk for fractures or osteoporosis.  Skin self-exam. / Monthly.  Influenza vaccine. / Every year.  Tetanus, diphtheria, and acellular pertussis (Tdap/Td) vaccine.** / 1 dose of Td every 10 years.  Varicella vaccine.** / Consult your health care provider.  Zoster vaccine.** / 1 dose for adults aged 19 years or older.  Pneumococcal 13-valent conjugate (PCV13) vaccine.** / Consult your health care provider.  Pneumococcal  polysaccharide (PPSV23) vaccine.** / 1 dose for all adults aged 45 years and older.  Meningococcal vaccine.** / Consult your health care provider.  Hepatitis A vaccine.** / Consult your health care provider.  Hepatitis B vaccine.** / Consult your health care provider.  Haemophilus influenzae type b (Hib) vaccine.** / Consult your health care provider. ** Family history and personal history of risk and conditions may change your health care provider's recommendations. Document Released: 12/11/2001 Document Revised: 03/01/2014 Document Reviewed: 03/12/2011 [facility] Patient Information 2015 [place], [place]. This information is not intended to replace advice given to you by your health care provider. Make sure you discuss any questions you have with your health care provider.

## 2014-12-02 NOTE — Progress Notes (Signed)
  Subjective:     Bridget Walters is a 29 y.o. female G2P2 with LMP 10/2014 and BMI 18 who is here for a comprehensive physical exam. The patient reports no problems. She is sexually active using Errin (POP) for contraception  History   Social History  . Marital Status: Married    Spouse Name: N/A    Number of Children: N/A  . Years of Education: N/A   Occupational History  . Not on file.   Social History Main Topics  . Smoking status: Never Smoker   . Smokeless tobacco: Not on file  . Alcohol Use: No  . Drug Use: No  . Sexual Activity: Yes   Other Topics Concern  . Not on file   Social History Narrative   Health Maintenance  Topic Date Due  . PAP SMEAR  02/16/2002  . TETANUS/TDAP  02/17/2003  . INFLUENZA VACCINE  05/29/2014  . HIV Screening  Completed   Past Medical History  Diagnosis Date  . history of blood in stool   . History of chicken pox   . History of fainting   . History of headache   . History of hay fever   . History of migraine   . History of UTI   . History of urinary incontinence    Past Surgical History  Procedure Laterality Date  . No past surgeries     Family History  Problem Relation Age of Onset  . Anesthesia problems Neg Hx   . Other Neg Hx   . Diabetes Father   . Cancer Father   . Diabetes Maternal Grandmother   . Diabetes Maternal Grandfather   . Hyperlipidemia Paternal Grandmother   . Cancer Paternal Grandmother   . Arthritis Maternal Grandmother   . Lung cancer Paternal Grandmother   . Prostate cancer Paternal Grandfather   . Hypertension Father   . Kidney disease Father     had Kidney transplant       Review of Systems A comprehensive review of systems was negative.   Objective:      GENERAL: Well-developed, well-nourished female in no acute distress.  HEENT: Normocephalic, atraumatic. Sclerae anicteric.  NECK: Supple. Normal thyroid.  LUNGS: Clear to auscultation bilaterally.  HEART: Regular rate and  rhythm. BREASTS: Symmetric in size. Palpable mass at 10 o'clock in periareolar region of right breast. No lymphadenopathy, skin changes, or nipple drainage. ABDOMEN: Soft, nontender, nondistended. No organomegaly. PELVIC: Normal external female genitalia. Vagina is pink and rugated.  Normal discharge. Normal appearing cervix. Uterus is normal in size. No adnexal mass or tenderness. EXTREMITIES: No cyanosis, clubbing, or edema, 2+ distal pulses.    Assessment:    Healthy female exam.      Plan:     Pap smear collected Patient advised to perform monthly self breast exams Patient will be contacted with any abnormal results Patient has been followed for this breast mass and is due for another follow up this month See After Visit Summary for Counseling Recommendations

## 2014-12-06 LAB — CYTOLOGY - PAP

## 2015-02-07 ENCOUNTER — Other Ambulatory Visit: Payer: Self-pay | Admitting: *Deleted

## 2015-02-07 MED ORDER — NORETHINDRONE 0.35 MG PO TABS: 1.0000 | ORAL_TABLET | Freq: Every day | ORAL | Status: DC

## 2015-02-07 NOTE — Telephone Encounter (Signed)
Pharmacy sent request for refill of ocp for patient.  Refills authorized.

## 2015-09-20 IMAGING — MG MAM DGTL DIAGNOSTIC MAMMO W/CAD
9 series · 9 of 9 positions shown · non-contrast
Comparison: Baseline exam.

CLINICAL DATA: 28-year-old female with palpable right breast mass.

EXAM:
DIGITAL DIAGNOSTIC  BILATERAL MAMMOGRAM WITH TOMOSYNTHESIS AND CAD
ULTRASOUND BILATERAL BREAST

[R CC (1 of 2)]
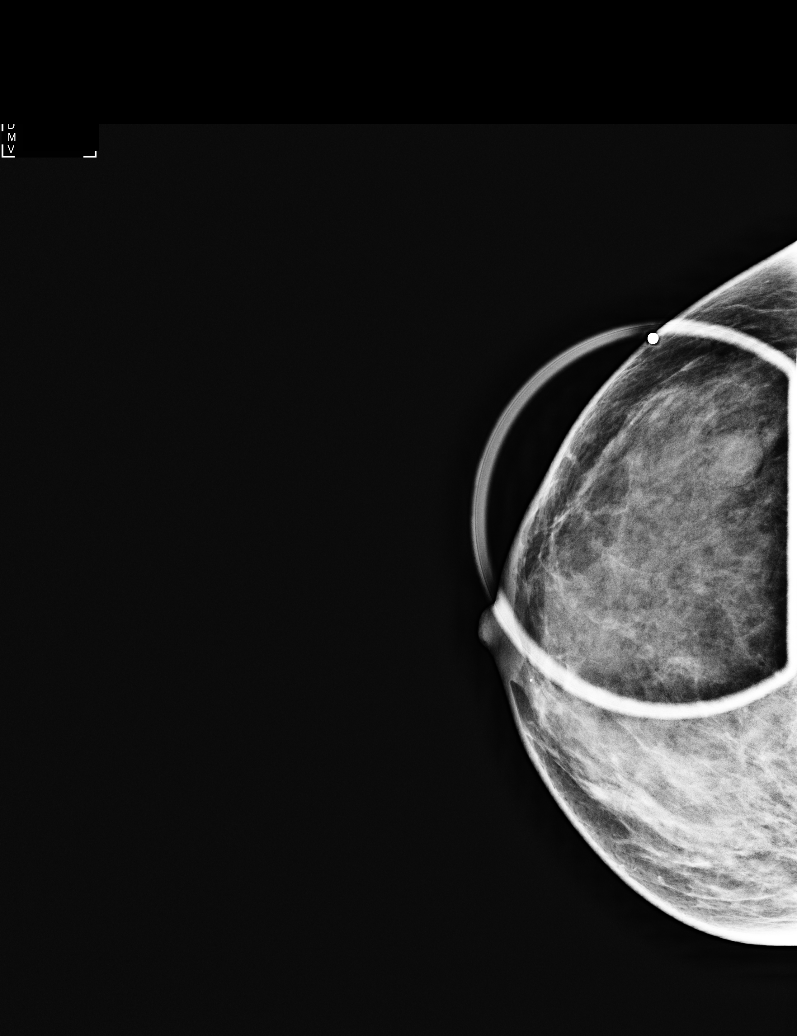

[R CC (2 of 2)]
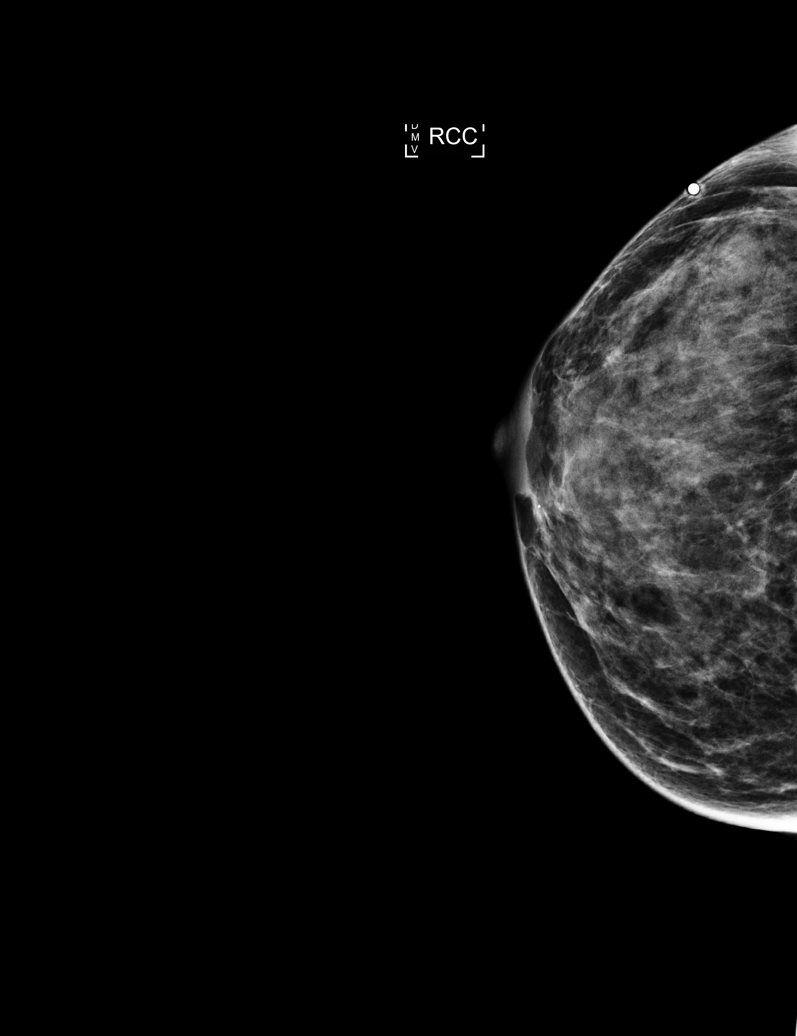

[L CC]
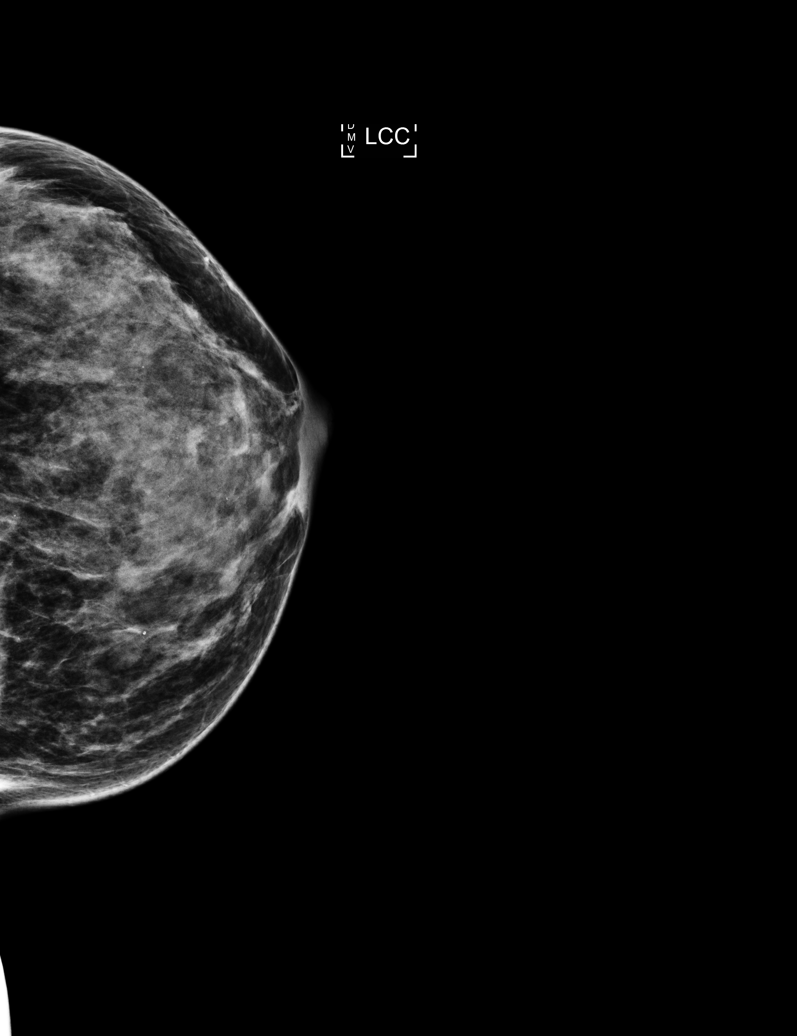

[R MLO]
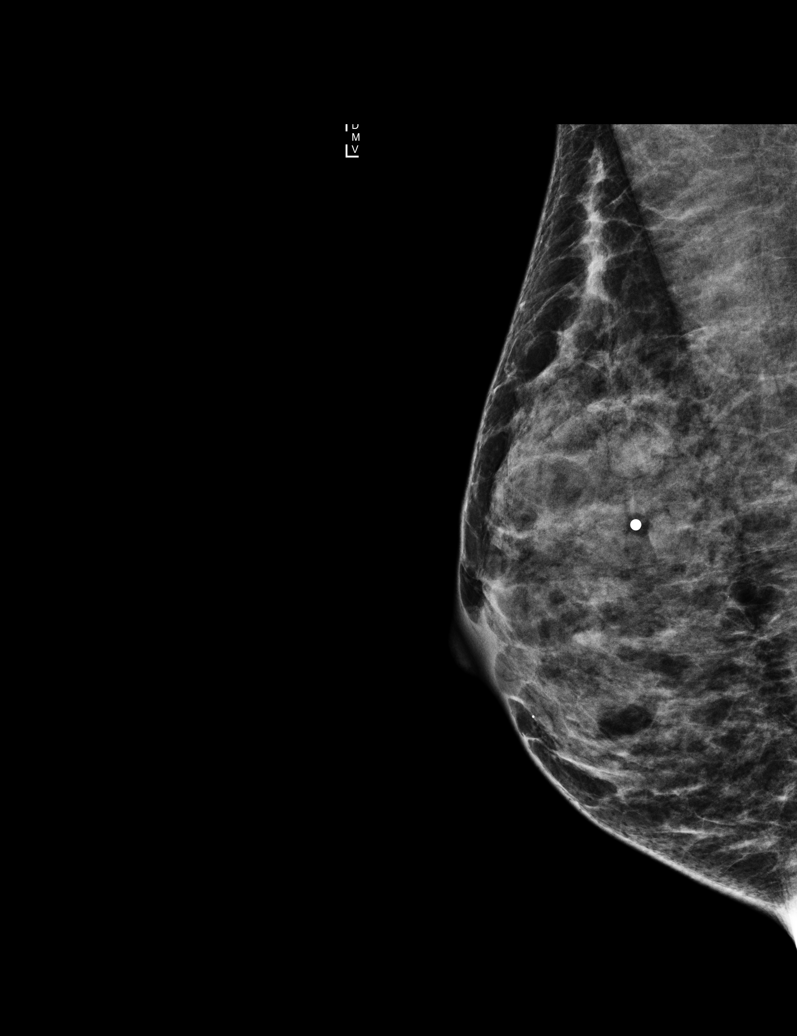

[L MLO]
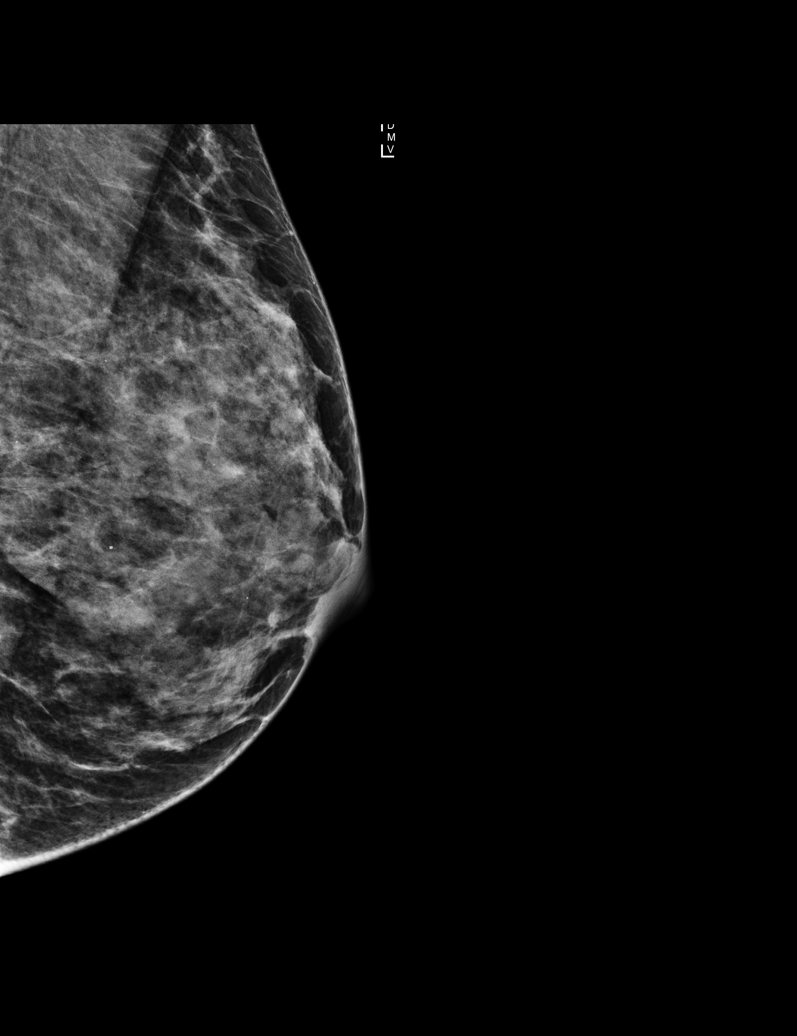

[L MLO tomo]
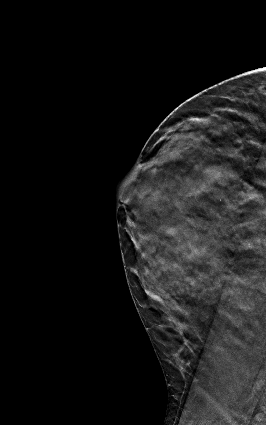

[L CC tomo]
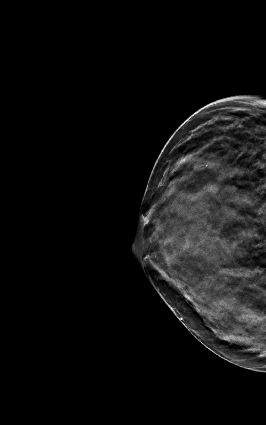

[R CC tomo]
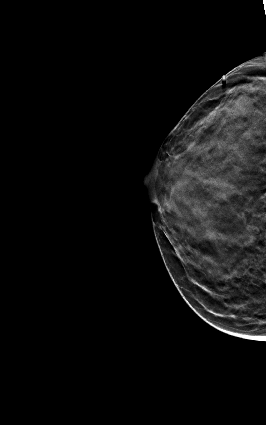

[R MLO tomo]
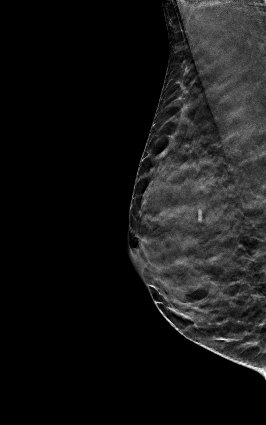

[9 of 9 positions shown; findings below may reference images not displayed]

ACR Breast Density Category d: The breast tissue is extremely dense,
which lowers the sensitivity of mammography.
FINDINGS: There is an approximately 12 mm circumscribed, oval mass within the
upper, outer right breast corresponding to the site of the palpable
marker. A possible asymmetry was seen within the medial left breast,
middle depth.

Mammographic images were processed with CAD.

Targeted physical exam of the area of patient's palpable concern
within the upper, outer right breast demonstrates an approximately 1
cm mobile soft, oval mass. No additional discrete mass is identified
within the right breast.

Ultrasound of the right breast was performed. There is an oval
circumscribed hypoechoic mass within the right breast at 10 o'clock,
5 cm from the nipple measuring 11 x 7 x 12 mm and corresponding to
the palpable abnormality. This mass likely represents a fibroadenoma
and corresponds to the mass seen on mammogram. No suspicious cystic
or solid mass was seen within the entirety of the right breast.

Targeted ultrasound of the medial left breast demonstrates no
sonographic correlate for the possible asymmetry seen within the
medial left breast on mammogram. This mammographic finding likely
represents normal breast tissue.
IMPRESSION: 1. Probably benign right breast mass, 10 o'clock.
2. Probably benign left breast asymmetry.

RECOMMENDATION:
1. Right breast ultrasound in 6 months.
2. Left diagnostic mammogram in 6 months.

I have discussed the findings and recommendations with the patient.
Results were also provided in writing at the conclusion of the
visit. If applicable, a reminder letter will be sent to the patient
regarding the next appointment.

BI-RADS CATEGORY  3: Probably benign.

## 2016-02-20 DIAGNOSIS — D1800 Hemangioma unspecified site: Secondary | ICD-10-CM | POA: Diagnosis not present

## 2016-02-20 DIAGNOSIS — D1801 Hemangioma of skin and subcutaneous tissue: Secondary | ICD-10-CM | POA: Diagnosis not present

## 2016-02-20 DIAGNOSIS — D225 Melanocytic nevi of trunk: Secondary | ICD-10-CM | POA: Diagnosis not present

## 2016-02-20 DIAGNOSIS — D229 Melanocytic nevi, unspecified: Secondary | ICD-10-CM | POA: Diagnosis not present

## 2016-02-20 DIAGNOSIS — L700 Acne vulgaris: Secondary | ICD-10-CM | POA: Diagnosis not present

## 2016-02-20 DIAGNOSIS — D485 Neoplasm of uncertain behavior of skin: Secondary | ICD-10-CM | POA: Diagnosis not present

## 2016-02-20 DIAGNOSIS — Z1283 Encounter for screening for malignant neoplasm of skin: Secondary | ICD-10-CM | POA: Diagnosis not present

## 2016-02-20 HISTORY — DX: Melanocytic nevi, unspecified: D22.9

## 2016-02-27 ENCOUNTER — Other Ambulatory Visit: Payer: Self-pay | Admitting: *Deleted

## 2016-02-27 ENCOUNTER — Telehealth: Payer: Self-pay | Admitting: Family Medicine

## 2016-02-27 DIAGNOSIS — Z309 Encounter for contraceptive management, unspecified: Secondary | ICD-10-CM

## 2016-02-27 MED ORDER — NORETHINDRONE 0.35 MG PO TABS: 1.0000 | ORAL_TABLET | Freq: Every day | ORAL | Status: DC

## 2016-02-27 NOTE — Telephone Encounter (Signed)
[facility]  Patient Name: Bridget Walters  DOB: [birth year]    Initial Comment Caller states having tightness in chest and sob, heart beating faster than normal   Nurse Assessment  Nurse: Wayne Sever, RN, Tillie Rung Date/Time (Eastern Time): 02/27/2016 10:05:46 AM  Confirm and document reason for call. If symptomatic, describe symptoms. You must click the next button to save text entered. ---Caller states she had a tightness in her chest on Saturday and she feels a bit short of breath this morning. She is up and doing normal activities.  Has the patient traveled out of the country within the last 30 days? ---Not Applicable  Does the patient have any new or worsening symptoms? ---Yes  Will a triage be completed? ---Yes  Related visit to physician within the last 2 weeks? ---N/A  Does the PT have any chronic conditions? (i.e. diabetes, asthma, etc.) ---No  Is the patient pregnant or possibly pregnant? (Ask all females between the ages of 34-55) ---No  Is this a behavioral health or substance abuse call? ---No     Guidelines    Guideline Title Affirmed Question Affirmed Notes  Chest Pain [1] Chest pain lasts > 5 minutes AND [2] occurred > 3 days ago (72 hours) AND [3] NO chest pain or cardiac symptoms now    Final Disposition User   See Physician within 24 Hours Adrian, RN, Tillie Rung    Comments  Scheduled with Dr Garnette Gunner at [phone] on 02/28/16   Referrals  REFERRED TO PCP OFFICE   Disagree/Comply: Comply

## 2016-02-27 NOTE — Telephone Encounter (Signed)
Refilled OCP since pt had annual this year

## 2016-02-27 NOTE — Telephone Encounter (Signed)
Pt has appt on 02/28/16 at 9:15 with Avie Echevaria NP.

## 2016-02-28 ENCOUNTER — Ambulatory Visit (INDEPENDENT_AMBULATORY_CARE_PROVIDER_SITE_OTHER): Payer: BLUE CROSS/BLUE SHIELD | Admitting: Internal Medicine

## 2016-02-28 ENCOUNTER — Encounter: Payer: Self-pay | Admitting: Internal Medicine

## 2016-02-28 VITALS — BP 108/60 | HR 79 | Temp 98.9°F | Wt 120.8 lb

## 2016-02-28 DIAGNOSIS — R0789 Other chest pain: Secondary | ICD-10-CM

## 2016-02-28 DIAGNOSIS — R42 Dizziness and giddiness: Secondary | ICD-10-CM | POA: Diagnosis not present

## 2016-02-28 DIAGNOSIS — R002 Palpitations: Secondary | ICD-10-CM

## 2016-02-28 LAB — BASIC METABOLIC PANEL
BUN: 17 mg/dL (ref 6–23)
CALCIUM: 10.1 mg/dL (ref 8.4–10.5)
CHLORIDE: 103 meq/L (ref 96–112)
CO2: 27 meq/L (ref 19–32)
CREATININE: 0.8 mg/dL (ref 0.40–1.20)
GFR: 88.33 mL/min (ref >60.00)
Glucose, Bld: 83 mg/dL (ref 70–99)
Potassium: 4.1 mEq/L (ref 3.5–5.1)
SODIUM: 138 meq/L (ref 135–145)

## 2016-02-28 LAB — CBC
HCT: 40.4 % (ref 36.0–46.0)
Hemoglobin: 13.9 g/dL (ref 12.0–15.0)
MCHC: 34.4 g/dL (ref 30.0–36.0)
MCV: 86.1 fl (ref 78.0–100.0)
Platelets: 345 10*3/uL (ref 150.0–400.0)
RBC: 4.69 Mil/uL (ref 3.87–5.11)
RDW: 12.3 % (ref 11.5–15.5)
WBC: 5.4 10*3/uL (ref 4.0–10.5)

## 2016-02-28 LAB — TSH: TSH: 1.41 u[IU]/mL (ref 0.35–4.50)

## 2016-02-28 NOTE — Patient Instructions (Signed)

## 2016-02-28 NOTE — Progress Notes (Signed)
Pre visit review using our clinic review tool, if applicable. No additional management support is needed unless otherwise documented below in the visit note. 

## 2016-02-28 NOTE — Progress Notes (Signed)
Subjective:    Patient ID: Bridget Walters, female    DOB: February 02, 1986, 30 y.o.   MRN: [id number]  HPI  Pt presents to the clinic today with c/o chest tightness and palpitations. This started 3 days ago. On Saturday, she had an episode of chest tightness and palpitations lasting several hours that dissipated without intervention. She did have some associated shortness of breath and lightheaded at times. She states the palpitations come on both at rest and during activity. She denies increased lightheadedness with change of position or standing from a seated position. She denies nausea, vomiting or diaphoresis. She reports this is how she feels when she is anxious, but she is not under increased stress and has no reason to be anxious.  She denies changes in weight, cold intolerance, fatigue, menorrhagia, thinning of hair or skin, or numbness/tingling. She has had reflux and states this feels different. She denies a family history of early-onset heart problems. She denies recent URI, congestion, fever, or any other allergy sxs.  LMP: 02/06/2016. Pt takes OCPs and does not think she is pregnant. Pt states she does not drink enough water, but she has been drinking the same amount as usual.   Review of Systems  Past Medical History  Diagnosis Date  . history of blood in stool   . History of chicken pox   . History of fainting   . History of headache   . History of hay fever   . History of migraine   . History of UTI   . History of urinary incontinence     Current Outpatient Prescriptions  Medication Sig Dispense Refill  . Multiple Vitamin (MULTIVITAMIN) tablet Take 1 tablet by mouth daily.    . norethindrone (ERRIN) 0.35 MG tablet Take 1 tablet (0.35 mg total) by mouth daily. 1 Package 11   No current facility-administered medications for this visit.    Allergies  Allergen Reactions  . Minocycline Hcl Hives  . Sumatriptan Nausea Only    Family History  Problem Relation Age of  Onset  . Anesthesia problems Neg Hx   . Other Neg Hx   . Diabetes Father   . Cancer Father   . Diabetes Maternal Grandmother   . Diabetes Maternal Grandfather   . Hyperlipidemia Paternal Grandmother   . Cancer Paternal Grandmother   . Arthritis Maternal Grandmother   . Lung cancer Paternal Grandmother   . Prostate cancer Paternal Grandfather   . Hypertension Father   . Kidney disease Father     had Kidney transplant    Social History   Social History  . Marital Status: Married    Spouse Name: N/A  . Number of Children: N/A  . Years of Education: N/A   Occupational History  . Not on file.   Social History Main Topics  . Smoking status: Never Smoker   . Smokeless tobacco: Not on file  . Alcohol Use: No  . Drug Use: No  . Sexual Activity: Yes   Other Topics Concern  . Not on file   Social History Narrative     Constitutional: Denies fever, malaise, fatigue, headache or abrupt weight changes.  HEENT: Denies runny nose, nasal congestion, or sore throat. Respiratory: Positive for shortness of breath. Denies difficulty breathing, or cough   Cardiovascular: Positive for palpitations and lightheadedness. Denies chest pain, current chest tightness, or swelling in the hands or feet.  Gastrointestinal: Denies nausea or vomiting  Musculoskeletal: Denies weakness or muscle pain or joint pain  Skin: Denies redness, rashes, lesions or ulcercations.  Psych: Denies anxiety.  No other specific complaints in a complete review of systems (except as listed in HPI above).     Objective:   Physical Exam  BP 108/60 mmHg  Pulse 79  Temp(Src) 98.9 F (37.2 C) (Oral)  Wt 120 lb 12 oz (54.772 kg)  SpO2 98%  LMP 02/06/2016 Wt Readings from Last 3 Encounters:  02/28/16 120 lb 12 oz (54.772 kg)  12/02/14 117 lb (53.071 kg)  07/19/14 119 lb 8 oz (54.205 kg)    General: Appears her stated age, well developed, well nourished in NAD. Skin: Warm, dry and intact. Neck:  Neck supple,  trachea midline. No masses, lumps or thyromegaly present.  Cardiovascular: Normal rate and rhythm. S1,S2 noted.  No murmur, rubs or gallops noted.  Pulmonary/Chest: Normal effort and positive vesicular breath sounds. No respiratory distress. No wheezes, rales or ronchi noted.  MSK: Pain not reproduced with palpation of the chest wall. Neurological: Alert and oriented. Coordination normal.  Psychiatric: Mood slightly anxious. Behavior is normal. Judgment and thought content normal.   EKG: NSR with no signs of ST elevation or ischemia  BMET    Component Value Date/Time   NA 139 01/19/2014 1639   K 4.4 01/19/2014 1639   CL 104 01/19/2014 1639   CO2 25 01/19/2014 1639   GLUCOSE 85 01/19/2014 1639   BUN 19 01/19/2014 1639   CREATININE 1.0 01/19/2014 1639   CALCIUM 10.0 01/19/2014 1639   GFRNONAA >90 02/04/2012 2055   GFRAA >90 02/04/2012 2055    Lipid Panel  No results found for: CHOL, TRIG, HDL, CHOLHDL, VLDL, LDLCALC  CBC    Component Value Date/Time   WBC 7.4 01/19/2014 1639   RBC 4.64 01/19/2014 1639   HGB 13.9 01/19/2014 1639   HCT 40.7 01/19/2014 1639   PLT 365.0 01/19/2014 1639   MCV 87.8 01/19/2014 1639   MCH 29.8 09/05/2012 0520   MCHC 34.2 01/19/2014 1639   RDW 12.4 01/19/2014 1639   LYMPHSABS 2.5 01/19/2014 1639   MONOABS 0.3 01/19/2014 1639   EOSABS 0.1 01/19/2014 1639   BASOSABS 0.1 01/19/2014 1639    Hgb A1C No results found for: HGBA1C        Assessment & Plan:   Chest tightness, palpitations and lightheadedness:  ECG: no acute findings Not concerning for any heart related issue No concern for PE Seems anxiety related Will check CBC, CMP, and TSH  Will follow up after labs, RTC as needed

## 2016-06-13 DIAGNOSIS — N390 Urinary tract infection, site not specified: Secondary | ICD-10-CM | POA: Diagnosis not present

## 2016-11-05 ENCOUNTER — Telehealth: Payer: Self-pay | Admitting: *Deleted

## 2016-11-05 MED ORDER — CYCLOBENZAPRINE HCL 10 MG PO TABS: 10.0000 mg | ORAL_TABLET | Freq: Three times a day (TID) | ORAL | 0 refills | Status: DC | PRN

## 2016-11-05 NOTE — Telephone Encounter (Signed)
I have not seen her since 2015 Cyclobenzaprine is ok for muscle pain (will not help joint pain)  Use caution for sedation  Sent to pharmacy  Schedule f/u this spring please -April or may is ok

## 2016-11-05 NOTE — Telephone Encounter (Signed)
Patient left a voicemail stating that she tried to get a refill on Cyclobenzaprine, but was advised that the refills have expired. Patient stated that this was given to her some time ago for joint pain and does not use it very often. Patient request that a new script be sent to [facility]

## 2016-11-06 NOTE — Telephone Encounter (Signed)
Pt notified of Dr. Marliss Coots comments and verbalized understanding. Pt scheduled an appt with Dr. Glori Bickers tomorrow at 6pm to discuss her muscle/neck issues

## 2016-11-07 ENCOUNTER — Ambulatory Visit: Payer: BLUE CROSS/BLUE SHIELD | Admitting: Family Medicine

## 2017-01-20 DIAGNOSIS — J101 Influenza due to other identified influenza virus with other respiratory manifestations: Secondary | ICD-10-CM | POA: Diagnosis not present

## 2017-01-20 DIAGNOSIS — J020 Streptococcal pharyngitis: Secondary | ICD-10-CM | POA: Diagnosis not present

## 2017-01-20 DIAGNOSIS — R509 Fever, unspecified: Secondary | ICD-10-CM | POA: Diagnosis not present

## 2017-01-20 DIAGNOSIS — J029 Acute pharyngitis, unspecified: Secondary | ICD-10-CM | POA: Diagnosis not present

## 2017-01-24 ENCOUNTER — Other Ambulatory Visit: Payer: Self-pay | Admitting: Obstetrics and Gynecology

## 2017-01-24 DIAGNOSIS — Z309 Encounter for contraceptive management, unspecified: Secondary | ICD-10-CM

## 2017-02-13 ENCOUNTER — Encounter: Payer: Self-pay | Admitting: Family Medicine

## 2017-02-13 ENCOUNTER — Ambulatory Visit (INDEPENDENT_AMBULATORY_CARE_PROVIDER_SITE_OTHER): Payer: BLUE CROSS/BLUE SHIELD | Admitting: Family Medicine

## 2017-02-13 VITALS — BP 104/68 | HR 70 | Temp 98.8°F | Ht 66.5 in | Wt 114.8 lb

## 2017-02-13 DIAGNOSIS — Z0000 Encounter for general adult medical examination without abnormal findings: Secondary | ICD-10-CM

## 2017-02-13 DIAGNOSIS — M545 Low back pain, unspecified: Secondary | ICD-10-CM

## 2017-02-13 DIAGNOSIS — Z23 Encounter for immunization: Secondary | ICD-10-CM

## 2017-02-13 DIAGNOSIS — G8929 Other chronic pain: Secondary | ICD-10-CM

## 2017-02-13 DIAGNOSIS — R636 Underweight: Secondary | ICD-10-CM | POA: Diagnosis not present

## 2017-02-13 LAB — COMPREHENSIVE METABOLIC PANEL
ALT: 14 U/L (ref 0–35)
AST: 16 U/L (ref 0–37)
Albumin: 4.8 g/dL (ref 3.5–5.2)
Alkaline Phosphatase: 48 U/L (ref 39–117)
BILIRUBIN TOTAL: 0.7 mg/dL (ref 0.2–1.2)
BUN: 17 mg/dL (ref 6–23)
CHLORIDE: 102 meq/L (ref 96–112)
CO2: 27 meq/L (ref 19–32)
CREATININE: 0.75 mg/dL (ref 0.40–1.20)
Calcium: 9.9 mg/dL (ref 8.4–10.5)
GFR: 94.59 mL/min (ref >60.00)
GLUCOSE: 95 mg/dL (ref 70–99)
Potassium: 3.6 mEq/L (ref 3.5–5.1)
SODIUM: 136 meq/L (ref 135–145)
Total Protein: 7.8 g/dL (ref 6.0–8.3)

## 2017-02-13 LAB — CBC WITH DIFFERENTIAL/PLATELET
Basophils Absolute: 0 10*3/uL (ref 0.0–0.1)
Basophils Relative: 0.5 % (ref 0.0–3.0)
Eosinophils Absolute: 0 10*3/uL (ref 0.0–0.7)
Eosinophils Relative: 0.4 % (ref 0.0–5.0)
HCT: 41.4 % (ref 36.0–46.0)
Hemoglobin: 14.2 g/dL (ref 12.0–15.0)
Lymphocytes Relative: 35.9 % (ref 12.0–46.0)
Lymphs Abs: 2.5 10*3/uL (ref 0.7–4.0)
MCHC: 34.3 g/dL (ref 30.0–36.0)
MCV: 86.6 fl (ref 78.0–100.0)
Monocytes Absolute: 0.4 10*3/uL (ref 0.1–1.0)
Monocytes Relative: 5.6 % (ref 3.0–12.0)
Neutro Abs: 4 10*3/uL (ref 1.4–7.7)
Neutrophils Relative %: 57.6 % (ref 43.0–77.0)
Platelets: 338 10*3/uL (ref 150.0–400.0)
RBC: 4.78 Mil/uL (ref 3.87–5.11)
RDW: 12.8 % (ref 11.5–15.5)
WBC: 7 10*3/uL (ref 4.0–10.5)

## 2017-02-13 LAB — LIPID PANEL
Cholesterol: 183 mg/dL (ref 0–200)
HDL: 69.3 mg/dL (ref >39.00)
LDL CALC: 104 mg/dL — AB (ref 0–99)
NonHDL: 113.88
TRIGLYCERIDES: 48 mg/dL (ref 0.0–149.0)
Total CHOL/HDL Ratio: 3
VLDL: 9.6 mg/dL (ref 0.0–40.0)

## 2017-02-13 LAB — TSH: TSH: 1.63 u[IU]/mL (ref 0.35–4.50)

## 2017-02-13 MED ORDER — TETANUS-DIPHTH-ACELL PERTUSSIS 5-2.5-18.5 LF-MCG/0.5 IM SUSP
0.5000 mL | Freq: Once | INTRAMUSCULAR | Status: AC
  Administered 2017-02-13: 0.5 mL via INTRAMUSCULAR

## 2017-02-13 NOTE — Patient Instructions (Addendum)
For day time calories - carnation instant breakfast or boost shake  Nuts are a great choice Also nut butters   Keep snacks around for afternoon as well   Try yoga for your back (easy to do at home)   Tdap today  Don't forget a flu shot next season   Follow up with your gyn at [facility]- if you need help making an appointment let us know   There is a vitamin called Biotin - for hair/ nail and skin health   Labs today

## 2017-02-13 NOTE — Assessment & Plan Note (Signed)
Reviewed health habits including diet and exercise and skin cancer prevention Reviewed appropriate screening tests for age  Also reviewed health mt list, fam hx and immunization status , as well as social and family history   See HPI Wellness labs today  Tdap vaccine today  Reminded her to get a flu vaccine each season  Reminded her to f/u with gyn for her pap and gyn care

## 2017-02-13 NOTE — Progress Notes (Signed)
Subjective:    Patient ID: Bridget Walters, female    DOB: 04/29/1986, 31 y.o.   MRN: [id number]  HPI  Here for health maintenance exam and to review chronic medical problems   Doing well   Staying home with her 33 yo and keeps her niece  Very busy  Not a lot of time for self care    Wt Readings from Last 3 Encounters:  02/13/17 114 lb 12 oz (52.1 kg)  02/28/16 120 lb 12 oz (54.8 kg)  12/02/14 117 lb (53.1 kg)  she tends to forget to eat and does not eat as healthy  Eats a good breakfast  Misses lunch  Gets busy- and she does not have a good appetite  bmi is low at 18.2     (she had bmi of 24 in 2013)  She has always been quite slim  Also a slim family  Fights that and does not have a great appetite  Does make healthy choices   Energy level is fairly good   Tetanus shot- had in 2007 Is caring for a 2 mo old - needs Tdap   Flu shot- did not get , and she did get the flu several weeks ago  Plans to get flu shot next season   Pap 2/16 with gyn- had neg pap with pos HPV  Now goes to [facility] = plans to return there Self breast exam - no lumps  Periods are sometimes irregular - sometimes close together and light / occ spotting  Heavy this past week  She is on progesterone only pill (no ha on this pill)   HIV screen neg 11/13  She had a diagnostic mm in 2015 for a mass She did get her 6 mo f/u and it was ok - gyn is watching this    Family hx : PGM lung cancer (smoker) Father had skin cancer    Review of Systems Review of Systems  Constitutional: Negative for fever, appetite change, fatigue and unexpected weight change.  Eyes: Negative for pain and visual disturbance.  Respiratory: Negative for cough and shortness of breath.   Cardiovascular: Negative for cp or palpitations    Gastrointestinal: Negative for nausea, diarrhea and constipation.  Genitourinary: Negative for urgency and frequency.  Skin: Negative for pallor or rash  pos for hair loss/  looses more than she thinks in brush etc  Neurological: Negative for weakness, light-headedness, numbness and headaches.  Hematological: Negative for adenopathy. Does not bruise/bleed easily.  Psychiatric/Behavioral: Negative for dysphoric mood. The patient is not nervous/anxious.          Objective:   Physical Exam  Constitutional: She appears well-developed and well-nourished. No distress.  Slim and well appearing   HENT:  Head: Normocephalic and atraumatic.  Right Ear: External ear normal.  Left Ear: External ear normal.  Mouth/Throat: Oropharynx is clear and moist.  Eyes: Conjunctivae and EOM are normal. Pupils are equal, round, and reactive to light. No scleral icterus.  Neck: Normal range of motion. Neck supple. No JVD present. Carotid bruit is not present. No thyromegaly present.  Cardiovascular: Normal rate, regular rhythm, normal heart sounds and intact distal pulses.  Exam reveals no gallop.   Pulmonary/Chest: Effort normal and breath sounds normal. No respiratory distress. She has no wheezes. She exhibits no tenderness.  Abdominal: Soft. Bowel sounds are normal. She exhibits no distension, no abdominal bruit and no mass. There is no tenderness.  Genitourinary: No breast swelling, tenderness, discharge or  bleeding.  Genitourinary Comments: Breast exam: No mass, nodules, thickening, tenderness, bulging, retraction, inflamation, nipple discharge or skin changes noted.  No axillary or clavicular LA.      Musculoskeletal: Normal range of motion. She exhibits no edema or tenderness.  Lymphadenopathy:    She has no cervical adenopathy.  Neurological: She is alert. She has normal reflexes. No cranial nerve deficit. She exhibits normal muscle tone. Coordination normal.  Skin: Skin is warm and dry. No rash noted. No erythema. No pallor.  Solar lentigines diffusely   Fine hair w/o focal alopecia   Psychiatric: She has a normal mood and affect.  Pleasant and cheerful             Assessment & Plan:   Problem List Items Addressed This Visit      Other   Back pain    On and off Improved with new mattress Disc yoga as an option to help flexibility also       Routine general medical examination at a health care facility    Reviewed health habits including diet and exercise and skin cancer prevention Reviewed appropriate screening tests for age  Also reviewed health mt list, fam hx and immunization status , as well as social and family history   See HPI Wellness labs today  Tdap vaccine today  Reminded her to get a flu vaccine each season  Reminded her to f/u with gyn for her pap and gyn care       Relevant Orders   CBC with Differential/Platelet   Comprehensive metabolic panel   Lipid panel   TSH   Underweight    Disc ways to add calories through the day - pt has naturally slim frame and low appetite Will try nuts/nut butters/carnation inst breakfast and meal reminders

## 2017-02-13 NOTE — Assessment & Plan Note (Signed)
On and off Improved with new mattress Disc yoga as an option to help flexibility also

## 2017-02-13 NOTE — Assessment & Plan Note (Signed)
Disc ways to add calories through the day - pt has naturally slim frame and low appetite Will try nuts/nut butters/carnation inst breakfast and meal reminders

## 2017-02-13 NOTE — Progress Notes (Signed)
Pre visit review using our clinic review tool, if applicable. No additional management support is needed unless otherwise documented below in the visit note. 

## 2017-03-04 DIAGNOSIS — L700 Acne vulgaris: Secondary | ICD-10-CM | POA: Diagnosis not present

## 2017-03-04 DIAGNOSIS — Z1283 Encounter for screening for malignant neoplasm of skin: Secondary | ICD-10-CM | POA: Diagnosis not present

## 2017-03-04 DIAGNOSIS — L578 Other skin changes due to chronic exposure to nonionizing radiation: Secondary | ICD-10-CM | POA: Diagnosis not present

## 2017-03-04 DIAGNOSIS — D485 Neoplasm of uncertain behavior of skin: Secondary | ICD-10-CM | POA: Diagnosis not present

## 2017-09-30 DIAGNOSIS — L905 Scar conditions and fibrosis of skin: Secondary | ICD-10-CM | POA: Diagnosis not present

## 2017-09-30 DIAGNOSIS — D229 Melanocytic nevi, unspecified: Secondary | ICD-10-CM | POA: Diagnosis not present

## 2017-09-30 DIAGNOSIS — D220 Melanocytic nevi of lip: Secondary | ICD-10-CM | POA: Diagnosis not present

## 2017-09-30 DIAGNOSIS — D485 Neoplasm of uncertain behavior of skin: Secondary | ICD-10-CM | POA: Diagnosis not present

## 2018-01-14 ENCOUNTER — Encounter: Payer: Self-pay | Admitting: Internal Medicine

## 2018-01-14 ENCOUNTER — Ambulatory Visit: Payer: BLUE CROSS/BLUE SHIELD | Admitting: Internal Medicine

## 2018-01-14 VITALS — BP 110/68 | HR 102 | Temp 98.4°F | Wt 119.5 lb

## 2018-01-14 DIAGNOSIS — R6889 Other general symptoms and signs: Secondary | ICD-10-CM | POA: Diagnosis not present

## 2018-01-14 DIAGNOSIS — J069 Acute upper respiratory infection, unspecified: Secondary | ICD-10-CM | POA: Diagnosis not present

## 2018-01-14 LAB — POC INFLUENZA A&B (BINAX/QUICKVUE)
Influenza A, POC: NEGATIVE
Influenza B, POC: NEGATIVE

## 2018-01-14 NOTE — Progress Notes (Signed)
Subjective:    Patient ID: Bridget Walters, female    DOB: 09-Apr-1986, 32 y.o.   MRN: [id number]  HPI Here due to acute illness  Throat pain, back ache, headache, neck pain --like glands and posterior Some chills but no clear fever Started yesterday--tried rest Not better today No cough or SOB Some ear pain Frontal HA and to occiput  Tried nyquil last night---did sleep  Current Outpatient Medications on File Prior to Visit  Medication Sig Dispense Refill  . cyclobenzaprine (FLEXERIL) 10 MG tablet Take 1 tablet (10 mg total) by mouth 3 (three) times daily as needed for muscle spasms. For muscle pain , use with caution of sedation 20 tablet 0  . Multiple Vitamin (MULTIVITAMIN) tablet Take 1 tablet by mouth daily.    . norethindrone (MICRONOR,CAMILA,ERRIN) 0.35 MG tablet TAKE 1 TABLET (0.35 MG TOTAL) BY MOUTH DAILY. 28 tablet 11   No current facility-administered medications on file prior to visit.     Allergies  Allergen Reactions  . Minocycline Hcl Hives  . Sumatriptan Nausea Only    Past Medical History:  Diagnosis Date  . history of blood in stool   . History of chicken pox   . History of fainting   . History of hay fever   . History of headache   . History of migraine   . History of urinary incontinence   . History of UTI     Past Surgical History:  Procedure Laterality Date  . NO PAST SURGERIES      Family History  Problem Relation Age of Onset  . Diabetes Father   . Cancer Father        skin cancer  . Hypertension Father   . Kidney disease Father        had Kidney transplant  . Diabetes Maternal Grandmother   . Arthritis Maternal Grandmother   . Diabetes Maternal Grandfather   . Hyperlipidemia Paternal Grandmother   . Cancer Paternal Grandmother   . Lung cancer Paternal Grandmother   . Prostate cancer Paternal Grandfather   . Anesthesia problems Neg Hx   . Other Neg Hx     Social History   Socioeconomic History  . Marital status:  Married    Spouse name: Not on file  . Number of children: Not on file  . Years of education: Not on file  . Highest education level: Not on file  Social Needs  . Financial resource strain: Not on file  . Food insecurity - worry: Not on file  . Food insecurity - inability: Not on file  . Transportation needs - medical: Not on file  . Transportation needs - non-medical: Not on file  Occupational History  . Not on file  Tobacco Use  . Smoking status: Never Smoker  . Smokeless tobacco: Never Used  Substance and Sexual Activity  . Alcohol use: No  . Drug use: No  . Sexual activity: Yes  Other Topics Concern  . Not on file  Social History Narrative   3 children    Stays home    Also cares for newborn niece (4/18)   Review of Systems Some ill exposures---with son's friends/school No vomiting or diarrhea No rash    Objective:   Physical Exam  Constitutional: She appears well-developed. No distress.  HENT:  Mouth/Throat: Oropharynx is clear and moist. No oropharyngeal exudate.  No sinus tenderness TMs normal Mild nasal inflammation  Neck: No thyromegaly present.  Pulmonary/Chest: Effort normal and breath sounds  normal. No respiratory distress. She has no wheezes. She has no rales.  Lymphadenopathy:    She has no cervical adenopathy.          Assessment & Plan:

## 2018-01-14 NOTE — Assessment & Plan Note (Signed)
Flu test negative and symptoms not that severe Discussed supportive care Discussed red flag symptoms

## 2018-03-17 DIAGNOSIS — D229 Melanocytic nevi, unspecified: Secondary | ICD-10-CM | POA: Diagnosis not present

## 2018-03-17 DIAGNOSIS — D2271 Melanocytic nevi of right lower limb, including hip: Secondary | ICD-10-CM | POA: Diagnosis not present

## 2018-03-17 DIAGNOSIS — D225 Melanocytic nevi of trunk: Secondary | ICD-10-CM | POA: Diagnosis not present

## 2018-03-17 DIAGNOSIS — D2272 Melanocytic nevi of left lower limb, including hip: Secondary | ICD-10-CM | POA: Diagnosis not present

## 2018-03-17 DIAGNOSIS — Z1283 Encounter for screening for malignant neoplasm of skin: Secondary | ICD-10-CM | POA: Diagnosis not present

## 2018-03-17 DIAGNOSIS — D485 Neoplasm of uncertain behavior of skin: Secondary | ICD-10-CM | POA: Diagnosis not present

## 2018-03-17 DIAGNOSIS — L578 Other skin changes due to chronic exposure to nonionizing radiation: Secondary | ICD-10-CM | POA: Diagnosis not present

## 2018-09-02 ENCOUNTER — Ambulatory Visit (INDEPENDENT_AMBULATORY_CARE_PROVIDER_SITE_OTHER)
Admission: RE | Admit: 2018-09-02 | Discharge: 2018-09-02 | Disposition: A | Discharge status: Home / Self Care | Payer: BLUE CROSS/BLUE SHIELD | Source: Ambulatory Visit | Attending: Primary Care | Admitting: Primary Care

## 2018-09-02 ENCOUNTER — Encounter: Payer: Self-pay | Admitting: Primary Care

## 2018-09-02 ENCOUNTER — Ambulatory Visit: Payer: BLUE CROSS/BLUE SHIELD | Admitting: Primary Care

## 2018-09-02 VITALS — BP 114/70 | HR 105 | Temp 100.5°F | Ht 66.0 in | Wt 116.0 lb

## 2018-09-02 DIAGNOSIS — R059 Cough, unspecified: Secondary | ICD-10-CM

## 2018-09-02 DIAGNOSIS — R05 Cough: Secondary | ICD-10-CM

## 2018-09-02 LAB — CBC WITH DIFFERENTIAL/PLATELET
BASOS ABS: 0 10*3/uL (ref 0.0–0.1)
BASOS PCT: 0.5 % (ref 0.0–3.0)
EOS ABS: 0 10*3/uL (ref 0.0–0.7)
Eosinophils Relative: 0 % (ref 0.0–5.0)
HEMATOCRIT: 39.6 % (ref 36.0–46.0)
HEMOGLOBIN: 13.9 g/dL (ref 12.0–15.0)
Lymphocytes Relative: 15.4 % (ref 12.0–46.0)
Lymphs Abs: 1 10*3/uL (ref 0.7–4.0)
MCHC: 35.2 g/dL (ref 30.0–36.0)
MCV: 85.9 fl (ref 78.0–100.0)
MONO ABS: 0.5 10*3/uL (ref 0.1–1.0)
Monocytes Relative: 7.9 % (ref 3.0–12.0)
NEUTROS ABS: 4.8 10*3/uL (ref 1.4–7.7)
Neutrophils Relative %: 76.2 % (ref 43.0–77.0)
PLATELETS: 296 10*3/uL (ref 150.0–400.0)
RBC: 4.6 Mil/uL (ref 3.87–5.11)
RDW: 12.5 % (ref 11.5–15.5)
WBC: 6.3 10*3/uL (ref 4.0–10.5)

## 2018-09-02 LAB — POC INFLUENZA A&B (BINAX/QUICKVUE)
INFLUENZA A, POC: NEGATIVE
INFLUENZA B, POC: NEGATIVE

## 2018-09-02 LAB — MONONUCLEOSIS SCREEN: Mono Screen: NEGATIVE

## 2018-09-02 NOTE — Patient Instructions (Signed)
Complete xray(s) and lab prior to leaving today. I will notify you of your results once received.  Start Tylenol or Ibuprofen for fevers and body aches. Do not exceed 3000 mg of Tylenol or 2400 mg of Ibuprofen in 24 hours.  You can try Delsym or Robitussin as needed for cough.  It was a pleasure meeting you!

## 2018-09-02 NOTE — Addendum Note (Signed)
Addended by: Jacqualin Combes on: 09/02/2018 12:39 PM   Modules accepted: Orders

## 2018-09-02 NOTE — Progress Notes (Signed)
Subjective:    Patient ID: Jonathon Bellows, female    DOB: 1986/10/03, 32 y.o.   MRN: [id number]  HPI  Ms. Bennion is a 32 year old female who presents today with a chief complaint of body aches.   She also reports nasal congestion, chest congestion, chills, headache, some cough. Her aches are located to her neck, shoulders, arms. Her symptoms began two days ago with an increase in symptoms yesterday. She did have a "cold" several weeks ago which resolved.   She's not checking her temperature at home. She's taken Mucinex and Nyquil yesterday without improvement. Her son as been sick over the last several months including pneumonia, viral URI, mono.   Review of Systems  Constitutional: Positive for chills and fever.  HENT: Positive for congestion and sore throat.   Respiratory: Positive for cough and shortness of breath.   Musculoskeletal: Positive for arthralgias and myalgias.       Past Medical History:  Diagnosis Date  . history of blood in stool   . History of chicken pox   . History of fainting   . History of hay fever   . History of headache   . History of migraine   . History of urinary incontinence   . History of UTI      Social History   Socioeconomic History  . Marital status: Married    Spouse name: Not on file  . Number of children: Not on file  . Years of education: Not on file  . Highest education level: Not on file  Occupational History  . Not on file  Social Needs  . Financial resource strain: Not on file  . Food insecurity:    Worry: Not on file    Inability: Not on file  . Transportation needs:    Medical: Not on file    Non-medical: Not on file  Tobacco Use  . Smoking status: Never Smoker  . Smokeless tobacco: Never Used  Substance and Sexual Activity  . Alcohol use: No  . Drug use: No  . Sexual activity: Yes  Lifestyle  . Physical activity:    Days per week: Not on file    Minutes per session: Not on file  . Stress: Not on file    Relationships  . Social connections:    Talks on phone: Not on file    Gets together: Not on file    Attends religious service: Not on file    Active member of club or organization: Not on file    Attends meetings of clubs or organizations: Not on file    Relationship status: Not on file  . Intimate partner violence:    Fear of current or ex partner: Not on file    Emotionally abused: Not on file    Physically abused: Not on file    Forced sexual activity: Not on file  Other Topics Concern  . Not on file  Social History Narrative   3 children    Stays home    Also cares for newborn niece (4/18)    Past Surgical History:  Procedure Laterality Date  . NO PAST SURGERIES      Family History  Problem Relation Age of Onset  . Diabetes Father   . Cancer Father        skin cancer  . Hypertension Father   . Kidney disease Father        had Kidney transplant  . Diabetes Maternal Grandmother   .  Arthritis Maternal Grandmother   . Diabetes Maternal Grandfather   . Hyperlipidemia Paternal Grandmother   . Cancer Paternal Grandmother   . Lung cancer Paternal Grandmother   . Prostate cancer Paternal Grandfather   . Anesthesia problems Neg Hx   . Other Neg Hx     Allergies  Allergen Reactions  . Minocycline Hcl Hives  . Sumatriptan Nausea Only    Current Outpatient Medications on File Prior to Visit  Medication Sig Dispense Refill  . cyclobenzaprine (FLEXERIL) 10 MG tablet Take 1 tablet (10 mg total) by mouth 3 (three) times daily as needed for muscle spasms. For muscle pain , use with caution of sedation 20 tablet 0  . Multiple Vitamin (MULTIVITAMIN) tablet Take 1 tablet by mouth daily.    . norethindrone (MICRONOR,CAMILA,ERRIN) 0.35 MG tablet TAKE 1 TABLET (0.35 MG TOTAL) BY MOUTH DAILY. 28 tablet 11   No current facility-administered medications on file prior to visit.     BP 114/70   Pulse (!) 105   Temp (!) 100.5 F (38.1 C) (Oral)   Ht 5\' 6"  (1.676 m)   Wt 116  lb (52.6 kg)   SpO2 99%   BMI 18.72 kg/m    Objective:   Physical Exam  Constitutional: She appears well-nourished. She appears ill.  HENT:  Right Ear: Tympanic membrane and ear canal normal.  Left Ear: Tympanic membrane and ear canal normal.  Nose: No mucosal edema. Right sinus exhibits no maxillary sinus tenderness and no frontal sinus tenderness. Left sinus exhibits no maxillary sinus tenderness and no frontal sinus tenderness.  Mouth/Throat: Oropharynx is clear and moist.  Neck: Neck supple.  Few small cervical lymph nodes palpated to bilateral cervical chain. Soft, mobile, slightly enlarged.   Cardiovascular: Normal rate and regular rhythm.  Respiratory: Effort normal and breath sounds normal. She has no wheezes.  Lymphadenopathy:    She has cervical adenopathy.  Skin: Skin is warm and dry.           Assessment & Plan:  Viral URI:  Sudden onset of chills, body aches, fevers x 2 days ago. Exam today overall unremarkable, she does appear ill, vitals abnormal. Rapid flu negative. Given exposure to mono and pneumonia will get further work up.  Check labs including CBC and Mono screen.  Chest xray pending.  Discussed conservative treatment including Tylenol and Ibuprofen, Robitussin/Delsym.   Pleas Koch, NP

## 2018-09-07 ENCOUNTER — Encounter: Payer: Self-pay | Admitting: Family Medicine

## 2018-09-08 NOTE — Telephone Encounter (Signed)
Pt called and still has non prod cough, fever continues for the 8th day. Pt seen last week. Robitussin helps cough slightly and for short period. Pt wanted to know if needs to have reck. Pt scheduled appt with Dr Glori Bickers 09/09/18 at [place]. If pt condition worsens prior to appt during the night pt will go to ED otherwise will see Dr Glori Bickers on 09/09/18. FYI to Dr Glori Bickers.

## 2018-09-09 ENCOUNTER — Telehealth: Payer: Self-pay

## 2018-09-09 ENCOUNTER — Encounter: Payer: Self-pay | Admitting: Family Medicine

## 2018-09-09 ENCOUNTER — Ambulatory Visit: Payer: BLUE CROSS/BLUE SHIELD | Admitting: Family Medicine

## 2018-09-09 VITALS — BP 132/76 | HR 106 | Temp 99.9°F | Ht 66.0 in | Wt 114.2 lb

## 2018-09-09 DIAGNOSIS — J209 Acute bronchitis, unspecified: Secondary | ICD-10-CM | POA: Diagnosis not present

## 2018-09-09 MED ORDER — HYDROCOD POLST-CPM POLST ER 10-8 MG/5ML PO SUER: 5.0000 mL | Freq: Two times a day (BID) | ORAL | 0 refills | Status: DC | PRN

## 2018-09-09 MED ORDER — AZITHROMYCIN 250 MG PO TABS: ORAL_TABLET | ORAL | 0 refills | Status: DC

## 2018-09-09 NOTE — Progress Notes (Signed)
Subjective:    Patient ID: Bridget Walters, female    DOB: 10/02/1986, 32 y.o.   MRN: [id number]  HPI Here for uri symptoms and fever Saw NP clark on 11/5  A/p as follows Viral URI:   Sudden onset of chills, body aches, fevers x 2 days ago. Exam today overall unremarkable, she does appear ill, vitals abnormal. Rapid flu negative. Given exposure to mono and pneumonia will get further work up.  Check labs including CBC and Mono screen.  Chest xray pending.  Discussed conservative treatment including Tylenol and Ibuprofen, Robitussin/Delsym.    Pleas Koch, NP  Results for orders placed or performed in visit on 09/02/18  CBC with Differential/Platelet  Result Value Ref Range   WBC 6.3 4.0 - 10.5 K/uL   RBC 4.60 3.87 - 5.11 Mil/uL   Hemoglobin 13.9 12.0 - 15.0 g/dL   HCT 39.6 36.0 - 46.0 %   MCV 85.9 78.0 - 100.0 fl   MCHC 35.2 30.0 - 36.0 g/dL   RDW 12.5 11.5 - 15.5 %   Platelets 296.0 150.0 - 400.0 K/uL   Neutrophils Relative % 76.2 43.0 - 77.0 %   Lymphocytes Relative 15.4 12.0 - 46.0 %   Monocytes Relative 7.9 3.0 - 12.0 %   Eosinophils Relative 0.0 0.0 - 5.0 %   Basophils Relative 0.5 0.0 - 3.0 %   Neutro Abs 4.8 1.4 - 7.7 K/uL   Lymphs Abs 1.0 0.7 - 4.0 K/uL   Monocytes Absolute 0.5 0.1 - 1.0 K/uL   Eosinophils Absolute 0.0 0.0 - 0.7 K/uL   Basophils Absolute 0.0 0.0 - 0.1 K/uL  Mononucleosis screen  Result Value Ref Range   Mono Screen Negative Negative  POC Influenza A&B(BINAX/QUICKVUE)  Result Value Ref Range   Influenza A, POC Negative Negative   Influenza B, POC Negative Negative   Dg Chest 2 View  Result Date: 09/02/2018 CLINICAL DATA:  Cough, fever and pneumonia EXAM: CHEST - 2 VIEW COMPARISON:  None. FINDINGS: Lungs are hyperaerated and clear. Normal heart size. No pneumothorax or pleural effusion. IMPRESSION: No active cardiopulmonary disease. Electronically Signed   By: Marybelle Killings M.D.   On: 09/02/2018 16:40     Today pulse 106 Temp:  99.9 F (37.7 C)   Symptoms have changed some   Body aches improved Temp went down and then went back up   Friday 102.5 on Friday and aches again (that was the worst day)  Temp increases in afternoon   Terrible cough -cannot sleep (dry cough - rattling but nothing comes up so far)  Sweaty  Headache on and off  Neck is sore -it radiates to shoulder and arm  Exhausted   Not a lot of nasal congestion Some runniness  No purulent nasal d/c Ears are ok  Throat is not sore   No rash  No tick bites Drinking fluids   Robitussin DM  Tylenol and ibuprofen (none this am because no fever)  Has tried nyquil- did not help  Delsym did not help   Exposures- son has been sick (mono/transient synovitis in hip in the fall)  Then "walking pneumonia" in October    Patient Active Problem List   Diagnosis Date Noted  . Acute bronchitis 09/09/2018  . Routine general medical examination at a health care facility 02/13/2017  . Underweight 02/13/2017  . Pain, joint, multiple sites 01/19/2014  . Back pain 01/19/2014  . Migraine headache 01/19/2014   Past Medical History:  Diagnosis Date  .  history of blood in stool   . History of chicken pox   . History of fainting   . History of hay fever   . History of headache   . History of migraine   . History of urinary incontinence   . History of UTI    Past Surgical History:  Procedure Laterality Date  . NO PAST SURGERIES     Social History   Tobacco Use  . Smoking status: Never Smoker  . Smokeless tobacco: Never Used  Substance Use Topics  . Alcohol use: No  . Drug use: No   Family History  Problem Relation Age of Onset  . Diabetes Father   . Cancer Father        skin cancer  . Hypertension Father   . Kidney disease Father        had Kidney transplant  . Diabetes Maternal Grandmother   . Arthritis Maternal Grandmother   . Diabetes Maternal Grandfather   . Hyperlipidemia Paternal Grandmother   . Cancer Paternal Grandmother    . Lung cancer Paternal Grandmother   . Prostate cancer Paternal Grandfather   . Anesthesia problems Neg Hx   . Other Neg Hx    Allergies  Allergen Reactions  . Minocycline Hcl Hives  . Sumatriptan Nausea Only   Current Outpatient Medications on File Prior to Visit  Medication Sig Dispense Refill  . cyclobenzaprine (FLEXERIL) 10 MG tablet Take 1 tablet (10 mg total) by mouth 3 (three) times daily as needed for muscle spasms. For muscle pain , use with caution of sedation 20 tablet 0  . Multiple Vitamin (MULTIVITAMIN) tablet Take 1 tablet by mouth daily.    . norethindrone (MICRONOR,CAMILA,ERRIN) 0.35 MG tablet TAKE 1 TABLET (0.35 MG TOTAL) BY MOUTH DAILY. 28 tablet 11   No current facility-administered medications on file prior to visit.      Assessment & Plan:      Review of Systems  Constitutional: Positive for appetite change, fatigue and fever.  HENT: Positive for congestion, postnasal drip, rhinorrhea, sinus pressure, sneezing, sore throat and voice change. Negative for ear pain.   Eyes: Negative for pain and discharge.  Respiratory: Positive for cough. Negative for chest tightness, shortness of breath, wheezing and stridor.   Cardiovascular: Negative for chest pain.  Gastrointestinal: Positive for constipation. Negative for diarrhea, nausea and vomiting.       Poor appetite-not eating much   Genitourinary: Negative for frequency, hematuria and urgency.  Musculoskeletal: Negative for arthralgias and myalgias.  Skin: Negative for rash.  Neurological: Positive for headaches. Negative for dizziness, weakness and light-headedness.  Psychiatric/Behavioral: Negative for confusion and dysphoric mood.       Objective:   Physical Exam  Constitutional: She appears well-developed and well-nourished. No distress.  Slim fatigued appearing female  HENT:  Head: Normocephalic and atraumatic.  Right Ear: External ear normal.  Left Ear: External ear normal.  Mouth/Throat:  Oropharynx is clear and moist.  Nares are injected and congested  No sinus tenderness Clear rhinorrhea and post nasal drip   Eyes: Pupils are equal, round, and reactive to light. Conjunctivae and EOM are normal. Right eye exhibits no discharge. Left eye exhibits no discharge. No scleral icterus.  Neck: Normal range of motion. Neck supple.  No meningeal signs Able to flex neck fully w/o discomfort  Cardiovascular: Regular rhythm and normal heart sounds.  Mildly tachycardic  Pulmonary/Chest: Effort normal and breath sounds normal. No stridor. No respiratory distress. She has no wheezes. She has  no rales. She exhibits no tenderness.  Good air exch Harsh bs  Barky cough   No rales  Few scattered rhonchi No wheeze even on forced exp  Abdominal: Soft. Bowel sounds are normal. She exhibits no distension. There is no tenderness.  No HSM  Lymphadenopathy:    She has no cervical adenopathy.  Neurological: She is alert. No cranial nerve deficit. Coordination normal.  Skin: Skin is warm and dry. No rash noted. No erythema.  Psychiatric: She has a normal mood and affect.          Assessment & Plan:   Problem List Items Addressed This Visit      Respiratory   Acute bronchitis - Primary    Following over a week of severe uri symptoms and neg rapid flu test  Fever improved and returned Will cover for poss mycoplasma (was exposed) with azithromycin Disc symptomatic care - see instructions on AVS  tussionex for cough prn (watch for constipation)  Anti pyretics /analgesics as tolerated  Update if not starting to improve in a week or if worsening

## 2018-09-09 NOTE — Telephone Encounter (Signed)
Todd from [facility] said that [employer] is down and all electronic rx are being converted to fax request which adds time to filling the med. Pt is at pharmacy now and [facility] request to call in abx so pt will not have to wait; advised Todd Z pak 250 mg # 6 take 2 pills po today and then 1 pill po daily for 4 days. Todd voiced understanding. Nothing further needed.

## 2018-09-09 NOTE — Patient Instructions (Signed)
Rest Fluids Tylenol/motrin for fever   Take zithromax as directed  Try tussionex for cough - caution of sedation   Try a stool softener or miralax for constipation   Please keep [place] posted   Update if not starting to improve in a week or if worsening

## 2018-09-09 NOTE — Assessment & Plan Note (Signed)
Following over a week of severe uri symptoms and neg rapid flu test  Fever improved and returned Will cover for poss mycoplasma (was exposed) with azithromycin Disc symptomatic care - see instructions on AVS  tussionex for cough prn (watch for constipation)  Anti pyretics /analgesics as tolerated  Update if not starting to improve in a week or if worsening

## 2018-09-29 ENCOUNTER — Other Ambulatory Visit: Payer: Self-pay | Admitting: *Deleted

## 2018-10-08 ENCOUNTER — Other Ambulatory Visit: Payer: Self-pay | Admitting: *Deleted

## 2018-10-08 DIAGNOSIS — Z309 Encounter for contraceptive management, unspecified: Secondary | ICD-10-CM

## 2018-10-08 MED ORDER — NORETHINDRONE 0.35 MG PO TABS: 1.0000 | ORAL_TABLET | Freq: Every day | ORAL | 0 refills | Status: DC

## 2018-10-29 ENCOUNTER — Other Ambulatory Visit: Payer: Self-pay | Admitting: Obstetrics and Gynecology

## 2018-10-29 DIAGNOSIS — Z309 Encounter for contraceptive management, unspecified: Secondary | ICD-10-CM

## 2018-11-11 ENCOUNTER — Encounter: Payer: Self-pay | Admitting: Family Medicine

## 2018-11-12 MED ORDER — CYCLOBENZAPRINE HCL 10 MG PO TABS: 10.0000 mg | ORAL_TABLET | Freq: Three times a day (TID) | ORAL | 0 refills | Status: DC | PRN

## 2018-11-19 ENCOUNTER — Other Ambulatory Visit: Payer: Self-pay | Admitting: Obstetrics and Gynecology

## 2018-11-19 DIAGNOSIS — Z309 Encounter for contraceptive management, unspecified: Secondary | ICD-10-CM

## 2018-11-25 ENCOUNTER — Other Ambulatory Visit: Payer: Self-pay | Admitting: *Deleted

## 2018-11-25 DIAGNOSIS — Z309 Encounter for contraceptive management, unspecified: Secondary | ICD-10-CM

## 2018-11-25 MED ORDER — NORETHINDRONE 0.35 MG PO TABS: 1.0000 | ORAL_TABLET | Freq: Every day | ORAL | 0 refills | Status: DC

## 2018-11-27 ENCOUNTER — Ambulatory Visit (INDEPENDENT_AMBULATORY_CARE_PROVIDER_SITE_OTHER): Payer: BLUE CROSS/BLUE SHIELD | Admitting: Internal Medicine

## 2018-11-27 ENCOUNTER — Encounter: Payer: Self-pay | Admitting: Internal Medicine

## 2018-11-27 VITALS — HR 93 | Temp 98.6°F | Wt 118.0 lb

## 2018-11-27 DIAGNOSIS — R059 Cough, unspecified: Secondary | ICD-10-CM

## 2018-11-27 DIAGNOSIS — R05 Cough: Secondary | ICD-10-CM

## 2018-11-27 MED ORDER — AMOXICILLIN 875 MG PO TABS: 875.0000 mg | ORAL_TABLET | Freq: Two times a day (BID) | ORAL | 0 refills | Status: DC

## 2018-11-27 NOTE — Progress Notes (Signed)
HPI  Pt presents to the clinic today with c/o cough. She reports this started 2 weeks ago. The cough is productive of thick green/brown mucous. She denies headache, runny nose, nasal congestion, ear pain, sore throat or shortness of breath. She denies fever, chills or body aches. She has tried Zyrtec and Sudafed with minimal relief. She has no history of allergies or asthma. She has had sick contacts. She was treated for bronchitis 08/2018.   Review of Systems      Past Medical History:  Diagnosis Date  . history of blood in stool   . History of chicken pox   . History of fainting   . History of hay fever   . History of headache   . History of migraine   . History of urinary incontinence   . History of UTI     Family History  Problem Relation Age of Onset  . Diabetes Father   . Cancer Father        skin cancer  . Hypertension Father   . Kidney disease Father        had Kidney transplant  . Diabetes Maternal Grandmother   . Arthritis Maternal Grandmother   . Diabetes Maternal Grandfather   . Hyperlipidemia Paternal Grandmother   . Cancer Paternal Grandmother   . Lung cancer Paternal Grandmother   . Prostate cancer Paternal Grandfather   . Anesthesia problems Neg Hx   . Other Neg Hx     Social History   Socioeconomic History  . Marital status: Married    Spouse name: Not on file  . Number of children: Not on file  . Years of education: Not on file  . Highest education level: Not on file  Occupational History  . Not on file  Social Needs  . Financial resource strain: Not on file  . Food insecurity:    Worry: Not on file    Inability: Not on file  . Transportation needs:    Medical: Not on file    Non-medical: Not on file  Tobacco Use  . Smoking status: Never Smoker  . Smokeless tobacco: Never Used  Substance and Sexual Activity  . Alcohol use: No  . Drug use: No  . Sexual activity: Yes  Lifestyle  . Physical activity:    Days per week: Not on file     Minutes per session: Not on file  . Stress: Not on file  Relationships  . Social connections:    Talks on phone: Not on file    Gets together: Not on file    Attends religious service: Not on file    Active member of club or organization: Not on file    Attends meetings of clubs or organizations: Not on file    Relationship status: Not on file  . Intimate partner violence:    Fear of current or ex partner: Not on file    Emotionally abused: Not on file    Physically abused: Not on file    Forced sexual activity: Not on file  Other Topics Concern  . Not on file  Social History Narrative   3 children    Stays home    Also cares for newborn niece (4/18)    Allergies  Allergen Reactions  . Minocycline Hcl Hives  . Sumatriptan Nausea Only     Constitutional:  Denies headache, fatigue, fever or abrupt weight changes.  HEENT:  Denies eye redness, eye pain, pressure behind the eyes, facial pain,  nasal congestion, ear pain, ringing in the ears, wax buildup, runny nose or sore throat. Respiratory: Positive cough. Denies difficulty breathing or shortness of breath.  Cardiovascular: Denies chest pain, chest tightness, palpitations or swelling in the hands or feet.   No other specific complaints in a complete review of systems (except as listed in HPI above).  Objective:   Pulse 93   Temp 98.6 F (37 C) (Oral)   Wt 118 lb (53.5 kg)   SpO2 98%   BMI 19.05 kg/m   Wt Readings from Last 3 Encounters:  09/09/18 114 lb 4 oz (51.8 kg)  09/02/18 116 lb (52.6 kg)  01/14/18 119 lb 8 oz (54.2 kg)     General: Appears her stated age, well developed, well nourished in NAD. HEENT: Head: normal shape and size, no sinus tenderness noted; Ears: Tm's gray and intact, normal light reflex; Nose: mucosa pink and moist, septum midline; Throat/Mouth: + PND. Teeth present, mucosa pink and moist, no exudate noted, no lesions or ulcerations noted.  Neck: No cervical lymphadenopathy.   Cardiovascular: Normal rate and rhythm. S1,S2 noted.  No murmur, rubs or gallops noted.  Pulmonary/Chest: Normal effort and positive vesicular breath sounds. No respiratory distress. No wheezes, rales or ronchi noted.       Assessment & Plan:   Cough:  Get some rest and drink plenty of water Start Zyrtec daily Given duration of symptoms, and discolored sputum, will treat with Amoxil 875 mg BID x 10 days Delsym as needed for cough  RTC as needed or if symptoms persist.   Webb Silversmith, NP

## 2018-11-27 NOTE — Patient Instructions (Signed)

## 2018-12-09 ENCOUNTER — Ambulatory Visit (INDEPENDENT_AMBULATORY_CARE_PROVIDER_SITE_OTHER): Payer: BLUE CROSS/BLUE SHIELD | Admitting: Obstetrics and Gynecology

## 2018-12-09 ENCOUNTER — Encounter: Payer: Self-pay | Admitting: Obstetrics and Gynecology

## 2018-12-09 VITALS — BP 108/70 | HR 83 | Ht 67.0 in | Wt 118.0 lb

## 2018-12-09 DIAGNOSIS — Z01419 Encounter for gynecological examination (general) (routine) without abnormal findings: Secondary | ICD-10-CM | POA: Diagnosis not present

## 2018-12-09 DIAGNOSIS — Z23 Encounter for immunization: Secondary | ICD-10-CM

## 2018-12-09 DIAGNOSIS — Z124 Encounter for screening for malignant neoplasm of cervix: Secondary | ICD-10-CM

## 2018-12-09 DIAGNOSIS — Z1151 Encounter for screening for human papillomavirus (HPV): Secondary | ICD-10-CM

## 2018-12-09 DIAGNOSIS — Z309 Encounter for contraceptive management, unspecified: Secondary | ICD-10-CM

## 2018-12-09 MED ORDER — NORETHINDRONE 0.35 MG PO TABS: 1.0000 | ORAL_TABLET | Freq: Every day | ORAL | 5 refills | Status: DC

## 2018-12-09 NOTE — Progress Notes (Signed)
Normal pap 12/02/2014

## 2018-12-09 NOTE — Progress Notes (Signed)
Subjective:     Bridget Walters is a 33 y.o. female G78P2 with BMI 18 who is here for a comprehensive physical exam. The patient reports no problems. She is sexually active using POP for contraception. She reports a monthly period lasting 5-6 days. She denies any pelvic pain or abnormal discharge.   Past Medical History:  Diagnosis Date  . history of blood in stool   . History of chicken pox   . History of fainting   . History of hay fever   . History of headache   . History of migraine   . History of urinary incontinence   . History of UTI    Past Surgical History:  Procedure Laterality Date  . NO PAST SURGERIES     Family History  Problem Relation Age of Onset  . Diabetes Father   . Cancer Father        skin cancer  . Hypertension Father   . Kidney disease Father        had Kidney transplant  . Diabetes Maternal Grandmother   . Arthritis Maternal Grandmother   . Diabetes Maternal Grandfather   . Hyperlipidemia Paternal Grandmother   . Cancer Paternal Grandmother   . Lung cancer Paternal Grandmother   . Prostate cancer Paternal Grandfather   . Anesthesia problems Neg Hx   . Other Neg Hx     Social History   Socioeconomic History  . Marital status: Married    Spouse name: Not on file  . Number of children: Not on file  . Years of education: Not on file  . Highest education level: Not on file  Occupational History  . Not on file  Social Needs  . Financial resource strain: Not on file  . Food insecurity:    Worry: Not on file    Inability: Not on file  . Transportation needs:    Medical: Not on file    Non-medical: Not on file  Tobacco Use  . Smoking status: Never Smoker  . Smokeless tobacco: Never Used  Substance and Sexual Activity  . Alcohol use: No  . Drug use: No  . Sexual activity: Yes  Lifestyle  . Physical activity:    Days per week: Not on file    Minutes per session: Not on file  . Stress: Not on file  Relationships  . Social  connections:    Talks on phone: Not on file    Gets together: Not on file    Attends religious service: Not on file    Active member of club or organization: Not on file    Attends meetings of clubs or organizations: Not on file    Relationship status: Not on file  . Intimate partner violence:    Fear of current or ex partner: Not on file    Emotionally abused: Not on file    Physically abused: Not on file    Forced sexual activity: Not on file  Other Topics Concern  . Not on file  Social History Narrative   3 children    Stays home    Also cares for newborn niece (4/18)   Health Maintenance  Topic Date Due  . PAP SMEAR-Modifier  12/02/2017  . INFLUENZA VACCINE  05/29/2018  . TETANUS/TDAP  02/14/2027  . HIV Screening  Completed       Review of Systems Pertinent items are noted in HPI.   Objective:  Blood pressure 108/70, pulse 83, height 5\' 7"  (1.702 m),  weight 118 lb (53.5 kg), last menstrual period 10/29/2018.     GENERAL: Well-developed, well-nourished female in no acute distress.  HEENT: Normocephalic, atraumatic. Sclerae anicteric.  NECK: Supple. Normal thyroid.  LUNGS: Clear to auscultation bilaterally.  HEART: Regular rate and rhythm. BREASTS: Symmetric in size. No palpable masses or lymphadenopathy, skin changes, or nipple drainage. ABDOMEN: Soft, nontender, nondistended. No organomegaly. PELVIC: Normal external female genitalia. Vagina is pink and rugated.  Normal discharge. Normal appearing cervix. Uterus is normal in size. No adnexal mass or tenderness. EXTREMITIES: No cyanosis, clubbing, or edema, 2+ distal pulses.    Assessment:    Healthy female exam.      Plan:    Pap smear collected Health maintenance labs ordered Refill on POP provided Flu vaccine today Patient will be contacted with abnormal results See After Visit Summary for Counseling Recommendations

## 2018-12-10 LAB — CBC
HEMATOCRIT: 40.9 % (ref 34.0–46.6)
Hemoglobin: 13.8 g/dL (ref 11.1–15.9)
MCH: 28.9 pg (ref 26.6–33.0)
MCHC: 33.7 g/dL (ref 31.5–35.7)
MCV: 86 fL (ref 79–97)
Platelets: 330 10*3/uL (ref 150–450)
RBC: 4.77 x10E6/uL (ref 3.77–5.28)
RDW: 13.1 % (ref 11.7–15.4)
WBC: 5 10*3/uL (ref 3.4–10.8)

## 2018-12-10 LAB — COMPREHENSIVE METABOLIC PANEL
ALT: 18 IU/L (ref 0–32)
AST: 17 IU/L (ref 0–40)
Albumin/Globulin Ratio: 2 (ref 1.2–2.2)
Albumin: 4.7 g/dL (ref 3.8–4.8)
Alkaline Phosphatase: 57 IU/L (ref 39–117)
BUN/Creatinine Ratio: 19 (ref 9–23)
BUN: 14 mg/dL (ref 6–20)
Bilirubin Total: 0.3 mg/dL (ref 0.0–1.2)
CO2: 23 mmol/L (ref 20–29)
Calcium: 9.9 mg/dL (ref 8.7–10.2)
Chloride: 102 mmol/L (ref 96–106)
Creatinine, Ser: 0.73 mg/dL (ref 0.57–1.00)
GFR calc Af Amer: 124 mL/min/{1.73_m2} (ref >59)
GFR calc non Af Amer: 108 mL/min/{1.73_m2} (ref >59)
Globulin, Total: 2.3 g/dL (ref 1.5–4.5)
Glucose: 80 mg/dL (ref 65–99)
Potassium: 4.1 mmol/L (ref 3.5–5.2)
Sodium: 139 mmol/L (ref 134–144)
Total Protein: 7 g/dL (ref 6.0–8.5)

## 2018-12-10 LAB — HEMOGLOBIN A1C
Est. average glucose Bld gHb Est-mCnc: 97 mg/dL
Hgb A1c MFr Bld: 5 % (ref 4.8–5.6)

## 2018-12-10 LAB — TSH: TSH: 1.78 u[IU]/mL (ref 0.450–4.500)

## 2018-12-12 LAB — CYTOLOGY - PAP
DIAGNOSIS: NEGATIVE
HPV: NOT DETECTED

## 2019-03-17 DIAGNOSIS — D485 Neoplasm of uncertain behavior of skin: Secondary | ICD-10-CM | POA: Diagnosis not present

## 2019-03-17 DIAGNOSIS — D1800 Hemangioma unspecified site: Secondary | ICD-10-CM | POA: Diagnosis not present

## 2019-03-17 DIAGNOSIS — D229 Melanocytic nevi, unspecified: Secondary | ICD-10-CM | POA: Diagnosis not present

## 2019-03-17 DIAGNOSIS — Z1283 Encounter for screening for malignant neoplasm of skin: Secondary | ICD-10-CM | POA: Diagnosis not present

## 2019-10-07 ENCOUNTER — Encounter: Payer: Self-pay | Admitting: Radiology

## 2019-11-03 IMAGING — DX DG CHEST 2V
2 series · 2 of 2 positions shown · non-contrast
Comparison: None.

CLINICAL DATA: Cough, fever and pneumonia

EXAM:
CHEST - 2 VIEW

[chest pa]
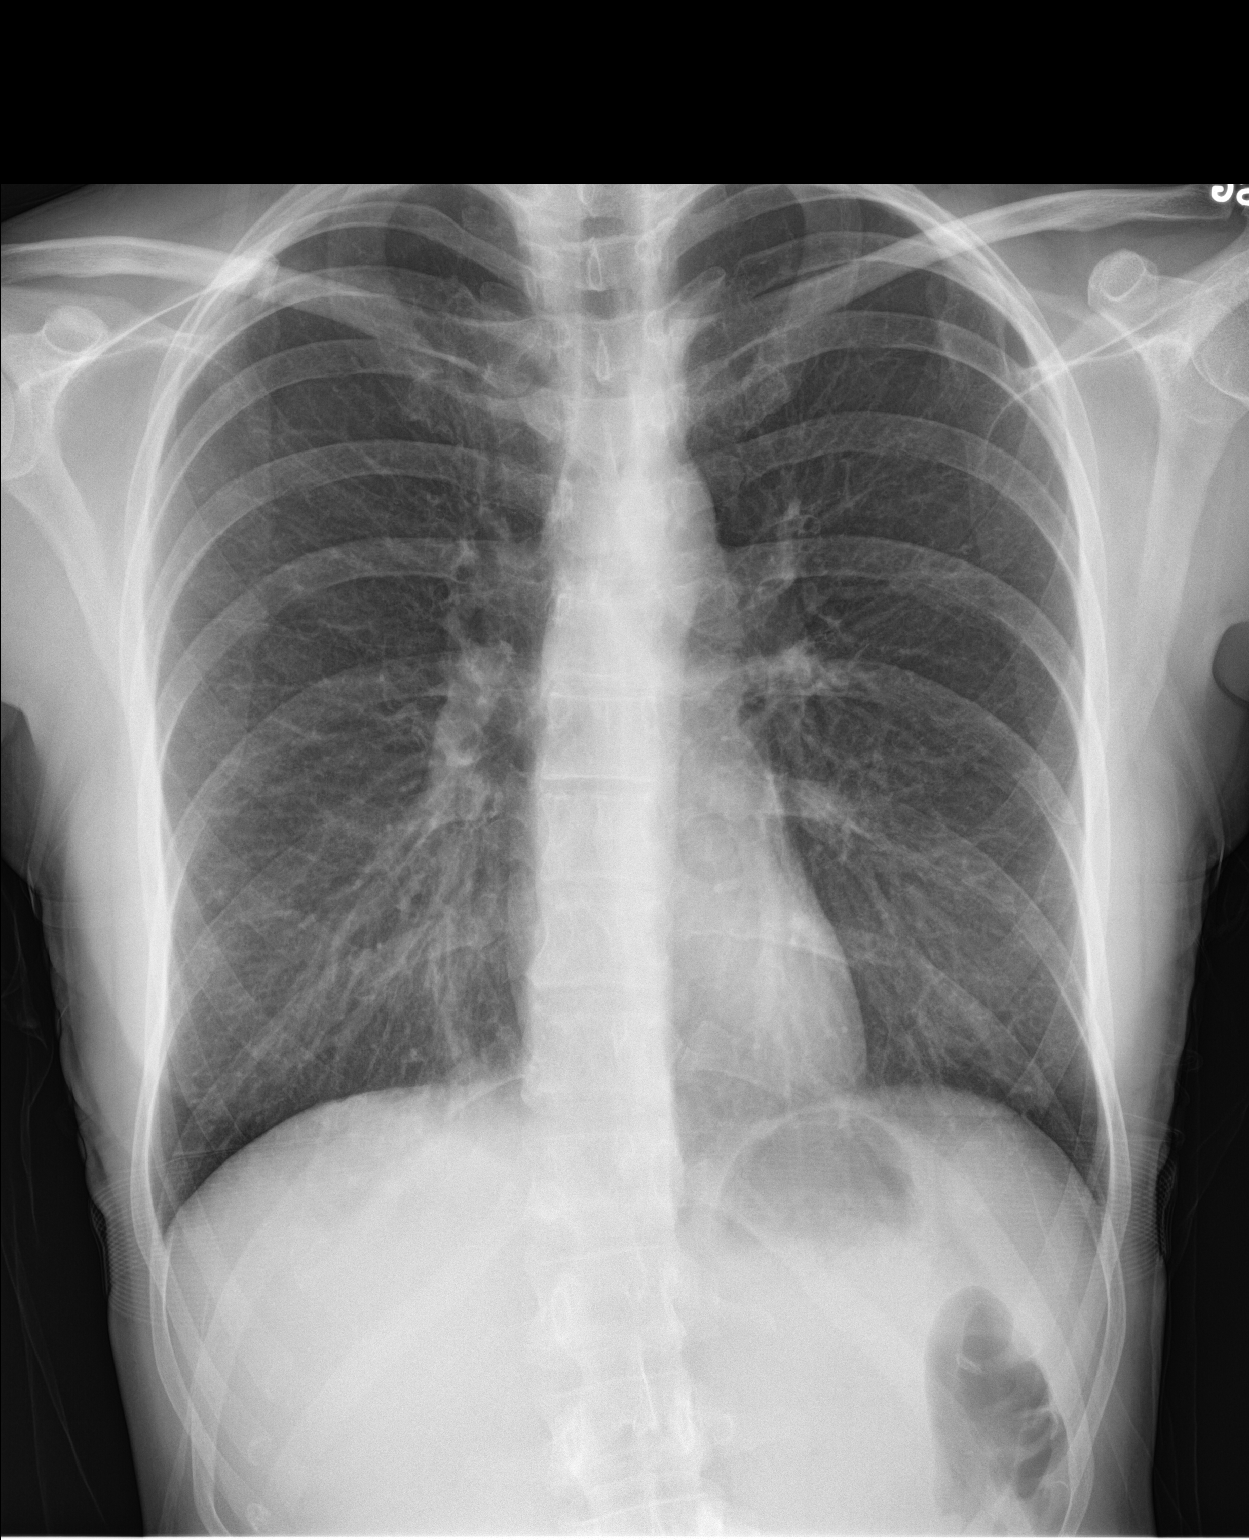

[chest lat]
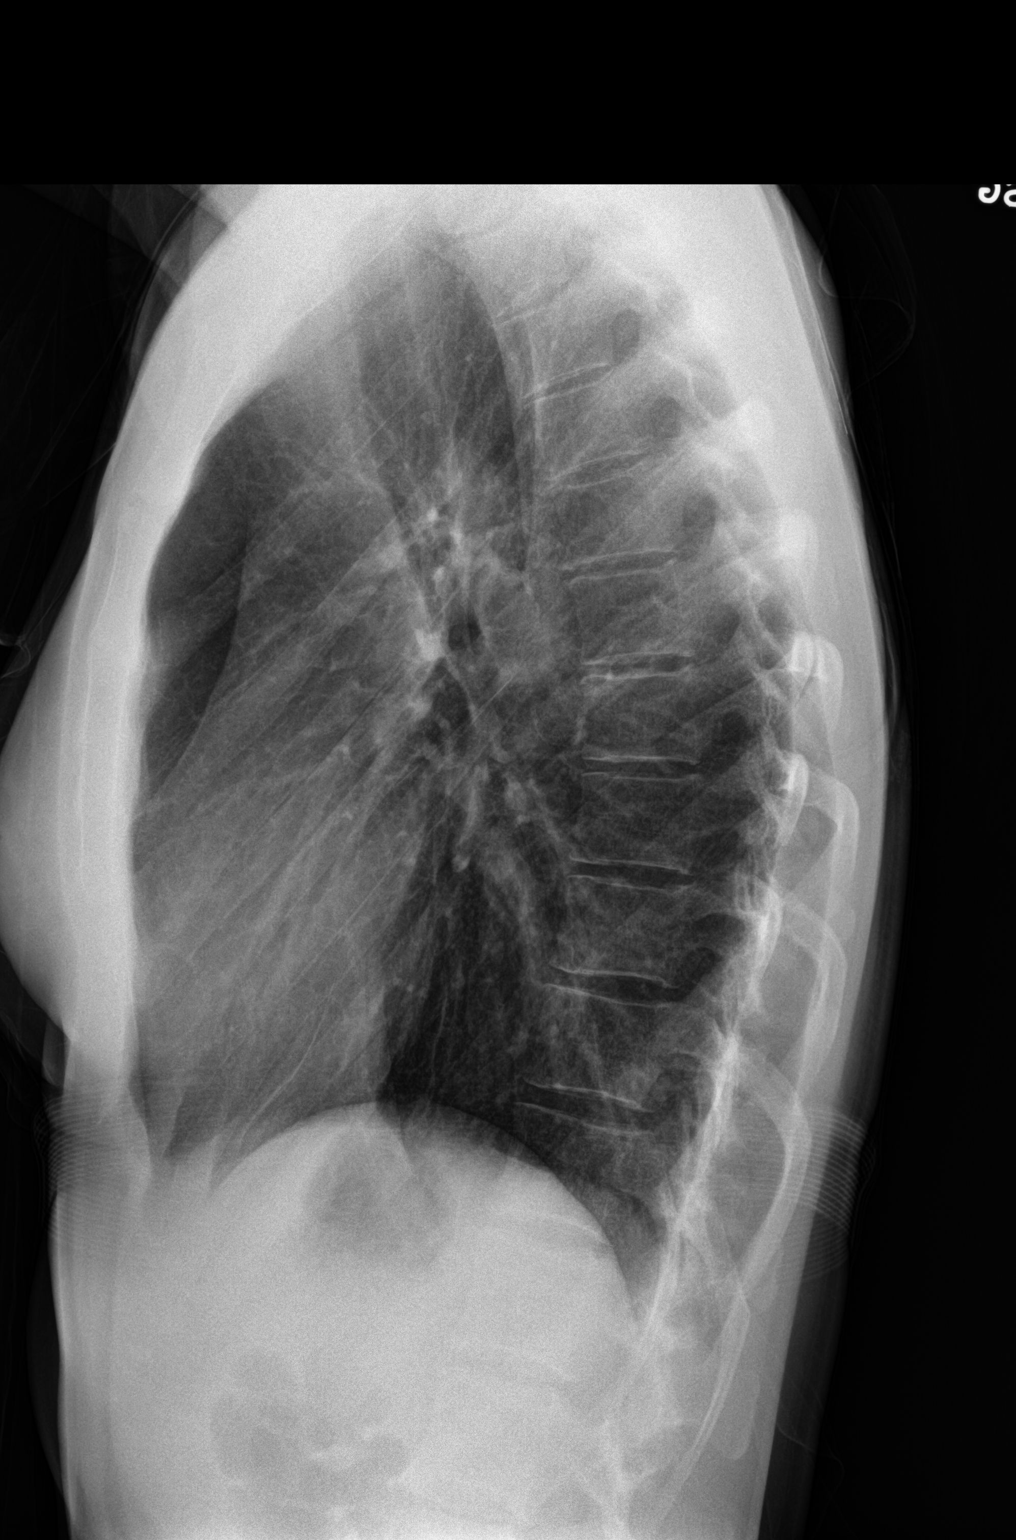

[2 of 2 positions shown; findings below may reference images not displayed]

FINDINGS: Lungs are hyperaerated and clear. Normal heart size. No pneumothorax
or pleural effusion.
IMPRESSION: No active cardiopulmonary disease.

## 2020-02-11 ENCOUNTER — Other Ambulatory Visit: Payer: Self-pay | Admitting: *Deleted

## 2020-02-11 DIAGNOSIS — Z309 Encounter for contraceptive management, unspecified: Secondary | ICD-10-CM

## 2020-02-11 MED ORDER — NORETHINDRONE 0.35 MG PO TABS: 1.0000 | ORAL_TABLET | Freq: Every day | ORAL | 5 refills | Status: DC

## 2020-03-22 ENCOUNTER — Ambulatory Visit: Payer: No Typology Code available for payment source | Admitting: Dermatology

## 2020-03-22 ENCOUNTER — Other Ambulatory Visit: Payer: Self-pay

## 2020-03-22 DIAGNOSIS — D229 Melanocytic nevi, unspecified: Secondary | ICD-10-CM

## 2020-03-22 DIAGNOSIS — Z86018 Personal history of other benign neoplasm: Secondary | ICD-10-CM | POA: Diagnosis not present

## 2020-03-22 DIAGNOSIS — D485 Neoplasm of uncertain behavior of skin: Secondary | ICD-10-CM | POA: Diagnosis not present

## 2020-03-22 DIAGNOSIS — B078 Other viral warts: Secondary | ICD-10-CM

## 2020-03-22 DIAGNOSIS — Z1283 Encounter for screening for malignant neoplasm of skin: Secondary | ICD-10-CM

## 2020-03-22 DIAGNOSIS — L739 Follicular disorder, unspecified: Secondary | ICD-10-CM | POA: Diagnosis not present

## 2020-03-22 DIAGNOSIS — D1801 Hemangioma of skin and subcutaneous tissue: Secondary | ICD-10-CM

## 2020-03-22 DIAGNOSIS — D239 Other benign neoplasm of skin, unspecified: Secondary | ICD-10-CM

## 2020-03-22 DIAGNOSIS — L578 Other skin changes due to chronic exposure to nonionizing radiation: Secondary | ICD-10-CM

## 2020-03-22 DIAGNOSIS — L814 Other melanin hyperpigmentation: Secondary | ICD-10-CM

## 2020-03-22 HISTORY — DX: Other benign neoplasm of skin, unspecified: D23.9

## 2020-03-22 NOTE — Progress Notes (Signed)
Follow-Up Visit   Subjective  Bridget Walters is a 34 y.o. female who presents for the following: Annual Exam (Hx Dysplastic Nevi) and bump (left index finger x 2-38mos, tender).  Mole on L hip seems to have changed, maybe darker?  She has a long h/o truncal acne- no prescriptions in past have helped.   The following portions of the chart were reviewed this encounter and updated as appropriate:      Review of Systems:  No other skin or systemic complaints except as noted in HPI or Assessment and Plan.  Objective  Well appearing patient in no apparent distress; mood and affect are within normal limits.  A focused examination was performed including extremities, including the arms, hands, fingers, and fingernails and the legs, feet, toes, and toenails. Relevant physical exam findings are noted in the Assessment and Plan.  Objective  Upper Back: Follicular-based pink papules.  Objective  Multiple, see history: Scar with no evidence of recurrence.   Objective  Left Buttock: 7.66mm brown papule     Objective  Right Buttock: 6.17mm brown macule  Objective  Left Index Finger, Palmar: Firm white papule   Assessment & Plan   Skin cancer screening performed today. Lentigines - Scattered tan macules - Discussed due to sun exposure - Benign, observe - Call for any changes  Melanocytic Nevi - Tan-brown and/or pink-flesh-colored symmetric macules and papules, increased on legs - Benign appearing on exam today - Observation - Call clinic for new or changing moles - Recommend daily use of broad spectrum spf 30+ sunscreen to sun-exposed areas.  Actinic Damage - diffuse scaly erythematous macules with underlying dyspigmentation - Recommend daily broad spectrum sunscreen SPF 30+ to sun-exposed areas, reapply every 2 hours as needed.  - Call for new or changing lesions.  Hemangiomas - Red papules - Discussed benign nature - Observe - Call for any  changes     Folliculitis Upper Back  Recommend benzoyl peroxide wash in shower. Sample of CeraVe 4% BP Wash given.  Benzoyl peroxide can cause dryness and irritation of the skin. It can also bleach fabric. When used together with Aczone (dapsone) cream, it can stain the skin orange.  Discussed topical clindamycin foam, pt defers at this time   History of dysplastic nevus Multiple, see history  Clear. Observe for recurrence. Call clinic for new or changing lesions.  Recommend regular skin exams, daily broad-spectrum spf 30+ sunscreen use, and photoprotection.     Neoplasm of uncertain behavior of skin Left Buttock  Epidermal / dermal shaving  Lesion length (cm):  0.7 Lesion width (cm):  0.7 Margin per side (cm):  0.1 Total excision diameter (cm):  0.9 Informed consent: discussed and consent obtained   Patient was prepped and draped in usual sterile fashion: Area prepped with alcohol. Anesthesia: the lesion was anesthetized in a standard fashion   Anesthetic:  1% lidocaine w/ epinephrine 1-100,000 buffered w/ 8.4% NaHCO3 Instrument used: flexible razor blade   Hemostasis achieved with: pressure, aluminum chloride and electrodesiccation   Outcome: patient tolerated procedure well   Post-procedure details: wound care instructions given   Post-procedure details comment:  Ointment and small bandage applied  Specimen 1 - Surgical pathology Differential Diagnosis: Nevus R/O Dysplasia Check Margins: No 7.53mm brown papule  Nevus Right Buttock  Benign-appearing.  Observation.  Call clinic for new or changing moles.  Recommend daily use of broad spectrum spf 30+ sunscreen to sun-exposed areas.    Other viral warts Left Index Finger, Palmar  Discussed viral etiology and risk of spread.  Discussed multiple treatments may be required to clear warts.  Discussed possible post-treatment dyspigmentation and risk of recurrence.   Destruction of lesion - Left Index Finger,  Palmar  Destruction method: cryotherapy   Informed consent: discussed and consent obtained   Lesion destroyed using liquid nitrogen: Yes   Region frozen until ice ball extended beyond lesion: Yes   Outcome: patient tolerated procedure well with no complications   Post-procedure details: wound care instructions given    Return in about 1 year (around 03/22/2021) for TBSE.   IJamesetta Orleans, CMA, am acting as scribe for Brendolyn Patty, MD .  Documentation: I have reviewed the above documentation for accuracy and completeness, and I agree with the above.  Brendolyn Patty MD

## 2020-03-22 NOTE — Patient Instructions (Addendum)
Cryotherapy Aftercare  . Wash gently with soap and water everyday.   Marland Kitchen Apply Vaseline and Band-Aid daily until healed.     Viral Warts & Molluscum Contagiosum  Viral warts and molluscum contagiosum are growths of the skin caused by viral infection of the skin. If you have been given the diagnosis of viral warts or molluscum contagiosum there are a few things that you must understand about your condition:  1. There is no guaranteed treatment method available for this condition. 2. Multiple treatments may be required, 3. The treatments may be time consuming and require multiple visits to the dermatology office. 4. The treatment may be expensive. You will be charged each time you come into the office to have the spots treated. 5. The treated areas may develop new lesions further complicating treatment. 6. The treated areas may leave a scar. 7. There is no guarantee that even after multiple treatments that the spots will be successfully treated. 8. These are caused by a viral infection and can be spread to other areas of the skin and to other people by direct contact. Therefore, new spots may occur.  Melanoma ABCDEs  Melanoma is the most dangerous type of skin cancer, and is the leading cause of death from skin disease.  You are more likely to develop melanoma if you:  Have light-colored skin, light-colored eyes, or red or blond hair  Spend a lot of time in the sun  Tan regularly, either outdoors or in a tanning bed  Have had blistering sunburns, especially during childhood  Have a close family member who has had a melanoma  Have atypical moles or large birthmarks  Early detection of melanoma is key since treatment is typically straightforward and cure rates are extremely high if we catch it early.   The first sign of melanoma is often a change in a mole or a new dark spot.  The ABCDE system is a way of remembering the signs of melanoma.  A for asymmetry:  The two halves do not  match. B for border:  The edges of the growth are irregular. C for color:  A mixture of colors are present instead of an even brown color. D for diameter:  Melanomas are usually (but not always) greater than 78mm - the size of a pencil eraser. E for evolution:  The spot keeps changing in size, shape, and color.  Please check your skin once per month between visits. You can use a small mirror in front and a large mirror behind you to keep an eye on the back side or your body.   If you see any new or changing lesions before your next follow-up, please call to schedule a visit.  Please continue daily skin protection including broad spectrum sunscreen SPF 30+ to sun-exposed areas, reapplying every 2 hours as needed when you're outdoors.  Wound Care Instructions  2. Cleanse wound gently with soap and water once a day then pat dry with clean gauze. Apply a thing coat of Petrolatum (petroleum jelly, "Vaseline") over the wound (unless you have an allergy to this). We recommend that you use a new, sterile tube of Vaseline. Do not pick or remove scabs. Do not remove the yellow or white "healing tissue" from the base of the wound.  3. Cover the wound with fresh, clean, nonstick gauze and secure with paper tape. You may use Band-Aids in place of gauze and tape if the would is small enough, but would recommend trimming much of the  tape off as there is often too much. Sometimes Band-Aids can irritate the skin.  4. You should call the office for your biopsy report after 1 week if you have not already been contacted.  5. If you experience any problems, such as abnormal amounts of bleeding, swelling, significant bruising, significant pain, or evidence of infection, please call the office immediately.

## 2020-03-24 ENCOUNTER — Telehealth: Payer: Self-pay

## 2020-03-24 NOTE — Telephone Encounter (Signed)
Advised pt of bx results/sh ?

## 2020-03-24 NOTE — Telephone Encounter (Signed)
-----   Message from Brendolyn Patty, MD sent at 03/23/2020  7:03 PM EDT ----- Skin , left buttock DYSPLASTIC COMPOUND NEVUS WITH MODERATE ATYPIA, PERIPHERAL MARGIN INVOLVED  Dysplastic nevus, moderate- will observe.

## 2020-05-11 ENCOUNTER — Ambulatory Visit
Admission: EM | Admit: 2020-05-11 | Discharge: 2020-05-11 | Disposition: A | Discharge status: Home / Self Care | Payer: No Typology Code available for payment source | Attending: Family Medicine | Admitting: Family Medicine

## 2020-05-11 DIAGNOSIS — J209 Acute bronchitis, unspecified: Secondary | ICD-10-CM | POA: Diagnosis not present

## 2020-05-11 MED ORDER — PREDNISONE 10 MG (21) PO TBPK: ORAL_TABLET | Freq: Every day | ORAL | 0 refills | Status: AC

## 2020-05-11 MED ORDER — CETIRIZINE HCL 10 MG PO TABS
10.0000 mg | ORAL_TABLET | Freq: Every day | ORAL | 0 refills | Status: DC
Start: 2020-05-11 — End: 2020-11-01

## 2020-05-11 NOTE — ED Provider Notes (Signed)
[place]   [id number] 05/11/20 Arrival Time: 52   SUBJECTIVE:  Bridget Walters is a 34 y.o. female who presents with complaint of nasal congestion, post-nasal drainage, and a persistent cough. Onset abrupt approximately 2 weeks ago. Overall fatigued. SOB: mild. Wheezing: moderate. OTC treatment: ibuprofen with no relief. Does not take any saily medications. Social History   Tobacco Use  Smoking Status Never Smoker  Smokeless Tobacco Never Used    ROS: As per HPI.   OBJECTIVE:  Vitals:   05/11/20 1312  BP: 124/80  Pulse: 85  Resp: 14  SpO2: 98%     General appearance: alert; no distress HEENT: nasal congestion; clear runny nose; throat irritation secondary to post-nasal drainage Neck: supple without LAD Lungs: diffuse wheezing throughout bilateral lung fields Skin: warm and dry Psychological: alert and cooperative; normal mood and affect  Results for orders placed or performed in visit on 12/09/18  CBC  Result Value Ref Range   WBC 5.0 3.4 - 10.8 x10E3/uL   RBC 4.77 3.77 - 5.28 x10E6/uL   Hemoglobin 13.8 11.1 - 15.9 g/dL   Hematocrit 40.9 34.0 - 46.6 %   MCV 86 79 - 97 fL   MCH 28.9 26.6 - 33.0 pg   MCHC 33.7 31 - 35 g/dL   RDW 13.1 11.7 - 15.4 %   Platelets 330 150 - 450 x10E3/uL  Comprehensive metabolic panel  Result Value Ref Range   Glucose 80 65 - 99 mg/dL   BUN 14 6 - 20 mg/dL   Creatinine, Ser 0.73 0.57 - 1.00 mg/dL   GFR calc non Af Amer 108 >59 mL/min/1.73   GFR calc Af Amer 124 >59 mL/min/1.73   BUN/Creatinine Ratio 19 9 - 23   Sodium 139 134 - 144 mmol/L   Potassium 4.1 3.5 - 5.2 mmol/L   Chloride 102 96 - 106 mmol/L   CO2 23 20 - 29 mmol/L   Calcium 9.9 8.7 - 10.2 mg/dL   Total Protein 7.0 6.0 - 8.5 g/dL   Albumin 4.7 3.8 - 4.8 g/dL   Globulin, Total 2.3 1.5 - 4.5 g/dL   Albumin/Globulin Ratio 2.0 1.2 - 2.2   Bilirubin Total 0.3 0.0 - 1.2 mg/dL   Alkaline Phosphatase 57 39 - 117 IU/L   AST 17 0 - 40 IU/L   ALT 18 0 -  32 IU/L  Hemoglobin A1c  Result Value Ref Range   Hgb A1c MFr Bld 5.0 4.8 - 5.6 %   Est. average glucose Bld gHb Est-mCnc 97 mg/dL  TSH  Result Value Ref Range   TSH 1.780 0.450 - 4.500 uIU/mL  Cytology - PAP( )  Result Value Ref Range   Adequacy      Satisfactory for evaluation  endocervical/transformation zone component PRESENT.   Diagnosis      NEGATIVE FOR INTRAEPITHELIAL LESIONS OR MALIGNANCY.   HPV NOT DETECTED    Material Submitted CervicoVaginal Pap [ThinPrep Imaged]    CYTOLOGY - PAP PAP RESULT     Labs Reviewed - No data to display  No results found.  Allergies  Allergen Reactions  . Minocycline Hcl Hives  . Sumatriptan Nausea Only    Past Medical History:  Diagnosis Date  . Atypical mole 02/20/2016   R lower back sup/mild-mod, R lower back inf/mild-mod  . Atypical mole 03/17/2018   R lateral ankle/mild, R buttock/mild, L lateral hip/mild  . history of blood in stool   . History of chicken pox   .  History of fainting   . History of hay fever   . History of headache   . History of migraine   . History of urinary incontinence   . History of UTI    Social History   Socioeconomic History  . Marital status: Married    Spouse name: Not on file  . Number of children: Not on file  . Years of education: Not on file  . Highest education level: Not on file  Occupational History  . Not on file  Tobacco Use  . Smoking status: Never Smoker  . Smokeless tobacco: Never Used  Vaping Use  . Vaping Use: Never used  Substance and Sexual Activity  . Alcohol use: No  . Drug use: No  . Sexual activity: Yes    Birth control/protection: Pill  Other Topics Concern  . Not on file  Social History Narrative   3 children    Stays home    Also cares for newborn niece (4/18)   Social Determinants of Health   Financial Resource Strain:   . Difficulty of Paying Living Expenses:   Food Insecurity:   . Worried About Charity fundraiser in the Last Year:     . Arboriculturist in the Last Year:   Transportation Needs:   . Film/video editor (Medical):   Marland Kitchen Lack of Transportation (Non-Medical):   Physical Activity:   . Days of Exercise per Week:   . Minutes of Exercise per Session:   Stress:   . Feeling of Stress :   Social Connections:   . Frequency of Communication with Friends and Family:   . Frequency of Social Gatherings with Friends and Family:   . Attends Religious Services:   . Active Member of Clubs or Organizations:   . Attends Archivist Meetings:   Marland Kitchen Marital Status:   Intimate Partner Violence:   . Fear of Current or Ex-Partner:   . Emotionally Abused:   Marland Kitchen Physically Abused:   . Sexually Abused:    Family History  Problem Relation Age of Onset  . Diabetes Father   . Cancer Father        skin cancer  . Hypertension Father   . Kidney disease Father        had Kidney transplant  . Diabetes Maternal Grandmother   . Arthritis Maternal Grandmother   . Diabetes Maternal Grandfather   . Hyperlipidemia Paternal Grandmother   . Cancer Paternal Grandmother   . Lung cancer Paternal Grandmother   . Prostate cancer Paternal Grandfather   . Anesthesia problems Neg Hx   . Other Neg Hx      ASSESSMENT & PLAN:  1. Acute bronchitis, unspecified organism     Meds ordered this encounter  Medications  . predniSONE (STERAPRED UNI-PAK 21 TAB) 10 MG (21) TBPK tablet    Sig: Take by mouth daily for 6 days. Take 6 tablets on day 1, 5 tablets on day 2, 4 tablets on day 3, 3 tablets on day 4, 2 tablets on day 5, 1 tablet on day 6    Dispense:  21 tablet    Refill:  0    Order Specific Question:   Supervising Provider    Answer:   Chase Picket [id number]  . cetirizine (ZYRTEC ALLERGY) 10 MG tablet    Sig: Take 1 tablet (10 mg total) by mouth daily.    Dispense:  30 tablet    Refill:  0  Order Specific Question:   Supervising Provider    Answer:   Chase Picket [[id number]]    Prescribed Prednisone  taper Prescribed Zyrtec Likely that this is coming from allergies OTC symptom care as needed. Will plan f/u with PCP or here as needed.  Reviewed expectations re: course of current medical issues. Questions answered. Outlined signs and symptoms indicating need for more acute intervention. Patient verbalized understanding. After Visit Summary given.             Faustino Congress, NP 05/11/20 1328

## 2020-05-11 NOTE — Discharge Instructions (Addendum)
I have sent in a prednisone taper for you to take for 6 days. 6 tablets on day one, 5 tablets on day two, 4 tablets on day three, 3 tablets on day four, 2 tablets on day five, and 1 tablet on day six.  I would also have you start taking a daily antihistamine like Zyrtec.  Follow up with this office or with primary care as needed  Follow up with the ER for trouble swallowing, trouble breathing, other concerning symptoms

## 2020-05-11 NOTE — ED Triage Notes (Signed)
Patient complains of cough and nasal congestion since June 25th.

## 2020-05-12 ENCOUNTER — Telehealth: Payer: No Typology Code available for payment source | Admitting: Family Medicine

## 2020-05-17 ENCOUNTER — Encounter: Payer: Self-pay | Admitting: Family Medicine

## 2020-08-08 ENCOUNTER — Other Ambulatory Visit: Payer: Self-pay | Admitting: *Deleted

## 2020-08-08 MED ORDER — PROMETHAZINE HCL 25 MG PO TABS: 25.0000 mg | ORAL_TABLET | Freq: Four times a day (QID) | ORAL | 2 refills | Status: DC | PRN

## 2020-08-08 MED ORDER — DOXYLAMINE-PYRIDOXINE 10-10 MG PO TBEC: 2.0000 | DELAYED_RELEASE_TABLET | Freq: Every day | ORAL | 5 refills | Status: DC

## 2020-08-15 ENCOUNTER — Inpatient Hospital Stay (HOSPITAL_COMMUNITY)
Admission: AD | Admit: 2020-08-15 | Discharge: 2020-08-15 | Disposition: A | Discharge status: Home / Self Care | Payer: No Typology Code available for payment source | Attending: Family Medicine | Admitting: Family Medicine

## 2020-08-15 ENCOUNTER — Other Ambulatory Visit: Payer: Self-pay

## 2020-08-15 ENCOUNTER — Encounter (HOSPITAL_COMMUNITY): Payer: Self-pay | Admitting: Obstetrics & Gynecology

## 2020-08-15 ENCOUNTER — Other Ambulatory Visit: Payer: Self-pay | Admitting: Family Medicine

## 2020-08-15 DIAGNOSIS — Z3A01 Less than 8 weeks gestation of pregnancy: Secondary | ICD-10-CM | POA: Insufficient documentation

## 2020-08-15 DIAGNOSIS — O219 Vomiting of pregnancy, unspecified: Secondary | ICD-10-CM | POA: Insufficient documentation

## 2020-08-15 DIAGNOSIS — O211 Hyperemesis gravidarum with metabolic disturbance: Secondary | ICD-10-CM

## 2020-08-15 DIAGNOSIS — Z87440 Personal history of urinary (tract) infections: Secondary | ICD-10-CM | POA: Diagnosis not present

## 2020-08-15 DIAGNOSIS — O09521 Supervision of elderly multigravida, first trimester: Secondary | ICD-10-CM | POA: Diagnosis not present

## 2020-08-15 DIAGNOSIS — Z79899 Other long term (current) drug therapy: Secondary | ICD-10-CM | POA: Insufficient documentation

## 2020-08-15 LAB — COMPREHENSIVE METABOLIC PANEL
ALT: 14 U/L (ref 0–44)
AST: 17 U/L (ref 15–41)
Albumin: 4 g/dL (ref 3.5–5.0)
Alkaline Phosphatase: 38 U/L (ref 38–126)
Anion gap: 11 (ref 5–15)
BUN: 20 mg/dL (ref 6–20)
CO2: 22 mmol/L (ref 22–32)
Calcium: 9.7 mg/dL (ref 8.9–10.3)
Chloride: 101 mmol/L (ref 98–111)
Creatinine, Ser: 0.7 mg/dL (ref 0.44–1.00)
GFR, Estimated: >60 mL/min (ref >60)
Glucose, Bld: 82 mg/dL (ref 70–99)
Potassium: 3.8 mmol/L (ref 3.5–5.1)
Sodium: 134 mmol/L — ABNORMAL LOW (ref 135–145)
Total Bilirubin: 0.8 mg/dL (ref 0.3–1.2)
Total Protein: 7 g/dL (ref 6.5–8.1)

## 2020-08-15 LAB — CBC
HCT: 39.6 % (ref 36.0–46.0)
Hemoglobin: 13.9 g/dL (ref 12.0–15.0)
MCH: 30.5 pg (ref 26.0–34.0)
MCHC: 35.1 g/dL (ref 30.0–36.0)
MCV: 87 fL (ref 80.0–100.0)
Platelets: 317 10*3/uL (ref 150–400)
RBC: 4.55 MIL/uL (ref 3.87–5.11)
RDW: 12 % (ref 11.5–15.5)
WBC: 9.5 10*3/uL (ref 4.0–10.5)
nRBC: 0 % (ref 0.0–0.2)

## 2020-08-15 LAB — URINALYSIS, ROUTINE W REFLEX MICROSCOPIC
Bilirubin Urine: NEGATIVE
Glucose, UA: NEGATIVE mg/dL
Hgb urine dipstick: NEGATIVE
Ketones, ur: 80 mg/dL — AB
Leukocytes,Ua: NEGATIVE
Nitrite: NEGATIVE
Protein, ur: NEGATIVE mg/dL
Specific Gravity, Urine: 1.026 (ref 1.005–1.030)
pH: 5 (ref 5.0–8.0)

## 2020-08-15 LAB — POCT PREGNANCY, URINE: Preg Test, Ur: POSITIVE — AB

## 2020-08-15 MED ORDER — DEXTROSE 5 % IN LACTATED RINGERS IV BOLUS
1000.0000 mL | Freq: Once | INTRAVENOUS | Status: AC
  Administered 2020-08-15: 1000 mL via INTRAVENOUS

## 2020-08-15 MED ORDER — PROMETHAZINE HCL 25 MG/ML IJ SOLN
25.0000 mg | Freq: Once | INTRAMUSCULAR | Status: AC
  Administered 2020-08-15: 25 mg via INTRAVENOUS
  Filled 2020-08-15: qty 1

## 2020-08-15 MED ORDER — ONDANSETRON HCL 4 MG/2ML IJ SOLN
4.0000 mg | Freq: Once | INTRAMUSCULAR | Status: AC
  Administered 2020-08-15: 4 mg via INTRAVENOUS
  Filled 2020-08-15: qty 2

## 2020-08-15 MED ORDER — LACTATED RINGERS IV BOLUS
1000.0000 mL | Freq: Once | INTRAVENOUS | Status: AC
  Administered 2020-08-15: 1000 mL via INTRAVENOUS

## 2020-08-15 NOTE — [facility]U Note (Addendum)
.   Bridget Walters is a 34 y.o. at [redacted]w[redacted]d here in [facility] reporting: nausea and vomiting for the past few weeks. She states that it got worse on Saturday. She has had x6 episodes of vomiting in the past 24 hours. Cannot retain liquid. No Vb or abdominal pain. Has had unisom and vitamin B6. Last time she had phenergan was last week because it was not helping. Reports she passed out Saturday. New OB appointment is 09/06/20 with Dr. Kennon Rounds.   Pain score: 0 Vitals:   08/15/20 1357  BP: 117/70  Pulse: 83  Resp: 16  Temp: 98.6 F (37 C)  SpO2: 100%    Lab orders placed from triage: UPT, UA

## 2020-08-15 NOTE — MAU Provider Note (Signed)
History     CSN: [id number]  Arrival date and time: 08/15/20 1258   First Provider Initiated Contact with Patient 08/15/20 1432      Chief Complaint  Patient presents with  . Nausea  . Emesis   Ms. Hoose is a 34 yo G3P2002 currently at 7.4w presenting with intractable nausea and vomiting x 2 weeks. She is scheduled for first prenatal visiti Nov. 9 with Dr. Kennon Rounds. After one week n/v, reached out to Dr. Kennon Rounds and was prescribed phenergan and diclegis without relief. N/v worsened over the second week. Reports "passing out" Saturday after feeling dizzy, woke up to "husband pouring orange juice down my throat until I came around". Has not been able to keep down solids or liquids intermittently for the past entire two weeks.    OB History    Gravida  3   Para  2   Term  2   Preterm  0   AB  0   Living  2     SAB  0   TAB  0   Ectopic  0   Multiple  0   Live Births  2           Past Medical History:  Diagnosis Date  . Atypical mole 02/20/2016   R lower back sup/mild-mod, R lower back inf/mild-mod  . Atypical mole 03/17/2018   R lateral ankle/mild, R buttock/mild, L lateral hip/mild  . history of blood in stool   . History of chicken pox   . History of fainting   . History of hay fever   . History of headache   . History of migraine   . History of urinary incontinence   . History of UTI     Past Surgical History:  Procedure Laterality Date  . NO PAST SURGERIES      Family History  Problem Relation Age of Onset  . Diabetes Father   . Cancer Father        skin cancer  . Hypertension Father   . Kidney disease Father        had Kidney transplant  . Diabetes Maternal Grandmother   . Arthritis Maternal Grandmother   . Diabetes Maternal Grandfather   . Hyperlipidemia Paternal Grandmother   . Cancer Paternal Grandmother   . Lung cancer Paternal Grandmother   . Prostate cancer Paternal Grandfather   . Anesthesia problems Neg Hx   . Other Neg Hx      Social History   Tobacco Use  . Smoking status: Never Smoker  . Smokeless tobacco: Never Used  Vaping Use  . Vaping Use: Never used  Substance Use Topics  . Alcohol use: No  . Drug use: No    Allergies:  Allergies  Allergen Reactions  . Minocycline Hcl Hives  . Sumatriptan Nausea Only    Medications Prior to Admission  Medication Sig Dispense Refill Last Dose  . doxylamine, Sleep, (UNISOM) 25 MG tablet Take 25 mg by mouth at bedtime as needed.   08/14/2020 at Unknown time  . Multiple Vitamin (MULTIVITAMIN) tablet Take 1 tablet by mouth daily.   08/14/2020 at Unknown time  . promethazine (PHENERGAN) 25 MG tablet Take 1 tablet (25 mg total) by mouth every 6 (six) hours as needed for nausea or vomiting. 30 tablet 2 Past Week at Unknown time  . vitamin B-6 (PYRIDOXINE) 25 MG tablet Take 25 mg by mouth every 6 (six) hours.   08/15/2020 at Unknown time  . amoxicillin (AMOXIL)  875 MG tablet Take 1 tablet (875 mg total) by mouth 2 (two) times daily. (Patient not taking: Reported on 12/09/2018) 20 tablet 0   . cetirizine (ZYRTEC ALLERGY) 10 MG tablet Take 1 tablet (10 mg total) by mouth daily. 30 tablet 0 More than a month at Unknown time  . cyclobenzaprine (FLEXERIL) 10 MG tablet Take 1 tablet (10 mg total) by mouth 3 (three) times daily as needed for muscle spasms. For muscle pain , use with caution of sedation 20 tablet 0 More than a month at Unknown time  . Doxylamine-Pyridoxine (DICLEGIS) 10-10 MG TBEC Take 2 tablets by mouth at bedtime. If symptoms persist, add one tablet in the morning and one in the afternoon 100 tablet 5   . norethindrone (MICRONOR) 0.35 MG tablet Take 1 tablet (0.35 mg total) by mouth daily. 3 Package 5     Review of Systems  Constitutional: Positive for appetite change. Negative for chills and diaphoresis.  Respiratory: Negative for shortness of breath.   Cardiovascular: Negative for chest pain, palpitations and leg swelling.  Gastrointestinal: Positive for  nausea and vomiting.  Genitourinary: Negative for pelvic pain.  Neurological: Positive for syncope, weakness and light-headedness. Negative for dizziness and tremors.   Physical Exam   Blood pressure 117/70, pulse 83, temperature 98.6 F (37 C), temperature source Oral, resp. rate 16, height 5\' 7"  (1.702 m), weight 55.3 kg, last menstrual period 06/23/2020, SpO2 100 %.  Physical Exam Constitutional:      Appearance: Normal appearance.  HENT:     Head: Normocephalic.  Cardiovascular:     Rate and Rhythm: Normal rate and regular rhythm.     Pulses: Normal pulses.     Heart sounds: Normal heart sounds.  Pulmonary:     Effort: Pulmonary effort is normal.     Breath sounds: Normal breath sounds. No wheezing or rhonchi.  Abdominal:     General: Abdomen is flat. There is no distension.     Palpations: Abdomen is soft.     Tenderness: There is no abdominal tenderness. There is no guarding.  Skin:    General: Skin is warm and dry.     Capillary Refill: Capillary refill takes less than 2 seconds.  Neurological:     General: No focal deficit present.     Mental Status: She is alert and oriented to person, place, and time. Mental status is at baseline.     MAU Course  Procedures  MDM Intractable nausea and vomiting Ms. Harkleroad reports intractable nausea and vomiting x 2 weeks. Admission POC bHCG positive. EGA 7.4w by LMP. Given UA indicating SG 1.026 and 80 ketones, started D5LR and administered IV zofran 4 mg twice. CBC wnl. CMP indicated sodium of 134, otherwise wnl. Normal bicarb and anion gap indicate she is not currently in ketoacidosis. Likely not hyperemesis gravidarum despite ketonuria due to absence of weight loss >5%. Nausea continued so she was given a second liter of D5LR and administered IV phenergan once, which provided some relief.   Assessment and Plan  Intractable nausea and vomiting Ms. Fetch is a 34 yo G3P2002 currently at 7.4w by LMP who presented with two weeks of  intractable nausea and vomiting. She experienced relief with 2L D5LR and IV phenergan. Two earlier rounds of IV zofran were without relief. We will schedule her for clinic-based IV infusions twice weekly for the next several weeks. Return precautions given, including those for hyperemesis gravidarum.   Ezequiel Essex 08/15/2020, 5:42 PM

## 2020-08-15 NOTE — Discharge Instructions (Signed)
Morning Sickness  Morning sickness is when a woman feels nauseous during pregnancy. This nauseous feeling may or may not come with vomiting. It often occurs in the morning, but it can be a problem at any time of day. Morning sickness is most common during the first trimester. In some cases, it may continue throughout pregnancy. Although morning sickness is unpleasant, it is usually harmless unless the woman develops severe and continual vomiting (hyperemesis gravidarum), a condition that requires more intense treatment. What are the causes? The exact cause of this condition is not known, but it seems to be related to normal hormonal changes that occur in pregnancy. What increases the risk? You are more likely to develop this condition if:  You experienced nausea or vomiting before your pregnancy.  You had morning sickness during a previous pregnancy.  You are pregnant with more than one baby, such as twins. What are the signs or symptoms? Symptoms of this condition include:  Nausea.  Vomiting. How is this diagnosed? This condition is usually diagnosed based on your signs and symptoms. How is this treated? In many cases, treatment is not needed for this condition. Making some changes to what you eat may help to control symptoms. Your health care provider may also prescribe or recommend:  Vitamin B6 supplements.  Anti-nausea medicines.  Ginger. Follow these instructions at home: Medicines  Take over-the-counter and prescription medicines only as told by your health care provider. Do not use any prescription, over-the-counter, or herbal medicines for morning sickness without first talking with your health care provider.  Taking multivitamins before getting pregnant can prevent or decrease the severity of morning sickness in most women. Eating and drinking  Eat a piece of dry toast or crackers before getting out of bed in the morning.  Eat 5 or 6 small meals a day.  Eat dry and  bland foods, such as [place] or a baked potato. Foods that are high in carbohydrates are often helpful.  Avoid greasy, fatty, and spicy foods.  Have someone cook for you if the smell of any food causes nausea and vomiting.  If you feel nauseous after taking prenatal vitamins, take the vitamins at night or with a snack.  Snack on protein foods between meals if you are hungry. Nuts, yogurt, and cheese are good options.  Drink fluids throughout the day.  Try ginger ale made with real ginger, ginger tea made from fresh grated ginger, or ginger candies. General instructions  Do not use any products that contain nicotine or tobacco, such as cigarettes and e-cigarettes. If you need help quitting, ask your health care provider.  Get an air purifier to keep the air in your house free of odors.  Get plenty of fresh air.  Try to avoid odors that trigger your nausea.  Consider trying these methods to help relieve symptoms: ? Wearing an acupressure wristband. These wristbands are often worn for seasickness. ? Acupuncture. Contact a health care provider if:  Your home remedies are not working and you need medicine.  You feel dizzy or light-headed.  You are losing weight. Get help right away if:  You have persistent and uncontrolled nausea and vomiting.  You faint.  You have severe pain in your abdomen. Summary  Morning sickness is when a woman feels nauseous during pregnancy. This nauseous feeling may or may not come with vomiting.  Morning sickness is most common during the first trimester.  It often occurs in the morning, but it can be a problem at  any time of day.  In many cases, treatment is not needed for this condition. Making some changes to what you eat may help to control symptoms. This information is not intended to replace advice given to you by your health care provider. Make sure you discuss any questions you have with your health care provider. Document Revised:  09/27/2017 Document Reviewed: 11/17/2016 Elsevier Patient Education  2020 [employer].   ____________________________________________________________________________   Hyperemesis Gravidarum Hyperemesis gravidarum is a severe form of nausea and vomiting that happens during pregnancy. Hyperemesis is worse than morning sickness. It may cause you to have nausea or vomiting all day for many days. It may keep you from eating and drinking enough food and liquids, which can lead to dehydration, malnutrition, and weight loss. Hyperemesis usually occurs during the first half (the first 20 weeks) of pregnancy. It often goes away once a woman is in her second half of pregnancy. However, sometimes hyperemesis continues through an entire pregnancy. What are the causes? The cause of this condition is not known. It may be related to changes in chemicals (hormones) in the body during pregnancy, such as the high level of pregnancy hormone (human chorionic gonadotropin) or the increase in the female sex hormone (estrogen). What are the signs or symptoms? Symptoms of this condition include:  Nausea that does not go away.  Vomiting that does not allow you to keep any food down.  Weight loss.  Body fluid loss (dehydration).  Having no desire to eat, or not liking food that you have previously enjoyed. How is this diagnosed? This condition may be diagnosed based on:  A physical exam.  Your medical history.  Your symptoms.  Blood tests.  Urine tests. How is this treated? This condition is managed by controlling symptoms. This may include:  Following an eating plan. This can help lessen nausea and vomiting.  Taking prescription medicines. An eating plan and medicines are often used together to help control symptoms. If medicines do not help relieve nausea and vomiting, you may need to receive fluids through an IV at the hospital. Follow these instructions at home: Eating and drinking   Avoid  the following: ? Drinking fluids with meals. Try not to drink anything during the 30 minutes before and after your meals. ? Drinking more than 1 cup of fluid at a time. ? Eating foods that trigger your symptoms. These may include spicy foods, coffee, high-fat foods, very sweet foods, and acidic foods. ? Skipping meals. Nausea can be more intense on an empty stomach. If you cannot tolerate food, do not force it. Try sucking on ice chips or other frozen items and make up for missed calories later. ? Lying down within 2 hours after eating. ? Being exposed to environmental triggers. These may include food smells, smoky rooms, closed spaces, rooms with strong smells, warm or humid places, overly loud and noisy rooms, and rooms with motion or flickering lights. Try eating meals in a well-ventilated area that is free of strong smells. ? Quick and sudden changes in your movement. ? Taking iron pills and multivitamins that contain iron. If you take prescription iron pills, do not stop taking them unless your health care provider approves. ? Preparing food. The smell of food can spoil your appetite or trigger nausea.  To help relieve your symptoms: ? Listen to your body. Everyone is different and has different preferences. Find what works best for you. ? Eat and drink slowly. ? Eat 5-6 small meals daily instead  of 3 large meals. Eating small meals and snacks can help you avoid an empty stomach. ? In the morning, before getting out of bed, eat a couple of crackers to avoid moving around on an empty stomach. ? Try eating starchy foods as these are usually tolerated well. Examples include cereal, toast, bread, potatoes, pasta, [place], and pretzels. ? Include at least 1 serving of protein with your meals and snacks. Protein options include lean meats, poultry, seafood, beans, nuts, nut butters, eggs, cheese, and yogurt. ? Try eating a protein-rich snack before bed. Examples of a protein-rick snack include cheese  and crackers or a peanut butter sandwich made with 1 slice of whole-wheat bread and 1 tsp (5 g) of peanut butter. ? Eat or suck on things that have ginger in them. It may help relieve nausea. Add  tsp ground ginger to hot tea or choose ginger tea. ? Try drinking 100% fruit juice or an electrolyte drink. An electrolyte drink contains sodium, potassium, and chloride. ? Drink fluids that are cold, clear, and carbonated or sour. Examples include lemonade, ginger ale, lemon-lime soda, ice water, and sparkling water. ? Brush your teeth or use a mouth rinse after meals. ? Talk with your health care provider about starting a supplement of vitamin B6. General instructions  Take over-the-counter and prescription medicines only as told by your health care provider.  Follow instructions from your health care provider about eating or drinking restrictions.  Continue to take your prenatal vitamins as told by your health care provider. If you are having trouble taking your prenatal vitamins, talk with your health care provider about different options.  Keep all follow-up and pre-birth (prenatal) visits as told by your health care provider. This is important. Contact a health care provider if:  You have pain in your abdomen.  You have a severe headache.  You have vision problems.  You are losing weight.  You feel weak or dizzy. Get help right away if:  You cannot drink fluids without vomiting.  You vomit blood.  You have constant nausea and vomiting.  You are very weak.  You faint.  You have a fever and your symptoms suddenly get worse. Summary  Hyperemesis gravidarum is a severe form of nausea and vomiting that happens during pregnancy.  Making some changes to your eating habits may help relieve nausea and vomiting.  This condition may be managed with medicine.  If medicines do not help relieve nausea and vomiting, you may need to receive fluids through an IV at the hospital. This  information is not intended to replace advice given to you by your health care provider. Make sure you discuss any questions you have with your health care provider. Document Revised: 11/04/2017 Document Reviewed: 06/13/2016 Elsevier Patient Education  [place].

## 2020-08-16 ENCOUNTER — Telehealth: Payer: Self-pay

## 2020-08-16 NOTE — Telephone Encounter (Signed)
Called to inform pt I had her scheduled for IV infusions per Dr. Nehemiah Settle.  Pt voiced understanding and request the information in a My Chart message.

## 2020-08-22 ENCOUNTER — Other Ambulatory Visit: Payer: Self-pay

## 2020-08-22 ENCOUNTER — Encounter (HOSPITAL_COMMUNITY)
Admission: RE | Admit: 2020-08-22 | Discharge: 2020-08-22 | Disposition: A | Discharge status: Home / Self Care | Payer: No Typology Code available for payment source | Source: Ambulatory Visit | Attending: Family Medicine | Admitting: Family Medicine

## 2020-08-22 ENCOUNTER — Telehealth: Payer: Self-pay | Admitting: *Deleted

## 2020-08-22 DIAGNOSIS — Z3A22 22 weeks gestation of pregnancy: Secondary | ICD-10-CM | POA: Diagnosis not present

## 2020-08-22 DIAGNOSIS — O210 Mild hyperemesis gravidarum: Secondary | ICD-10-CM | POA: Insufficient documentation

## 2020-08-22 MED ORDER — PROMETHAZINE HCL 25 MG/ML IJ SOLN
25.0000 mg | INTRAVENOUS | Status: DC
  Administered 2020-08-22: 25 mg via INTRAVENOUS
  Filled 2020-08-22 (×2): qty 1

## 2020-08-22 NOTE — Telephone Encounter (Signed)
[facility] called to office with question about orders.  Signed and held order for LR with phenergan to be given 3 x weekly.  Kristen at infusion center states pt thought she was getting a Banana bag and that makes her feel better.   Reviewed with Dr Rosana Hoes in office, pt can get infusion that was ordered or f/u in office for other treatment.  Advised that pt needs to be seen in outpt setting as well to continue plan of care.

## 2020-08-23 ENCOUNTER — Other Ambulatory Visit: Payer: Self-pay | Admitting: Family Medicine

## 2020-08-23 DIAGNOSIS — O211 Hyperemesis gravidarum with metabolic disturbance: Secondary | ICD-10-CM

## 2020-08-23 MED ORDER — ONDANSETRON 4 MG PO TBDP: 4.0000 mg | ORAL_TABLET | Freq: Four times a day (QID) | ORAL | 0 refills | Status: DC | PRN

## 2020-08-23 MED ORDER — SCOPOLAMINE 1 MG/3DAYS TD PT72: 1.0000 | MEDICATED_PATCH | TRANSDERMAL | 12 refills | Status: DC

## 2020-08-23 MED ORDER — METOCLOPRAMIDE HCL 10 MG PO TABS: 10.0000 mg | ORAL_TABLET | Freq: Four times a day (QID) | ORAL | 1 refills | Status: DC

## 2020-08-23 NOTE — Progress Notes (Signed)
Significant N/V--will add reglan, zofran, scopolamine patch, sea bands. Continue IVF repletion as needed.

## 2020-08-24 ENCOUNTER — Encounter (HOSPITAL_COMMUNITY)
Admission: RE | Admit: 2020-08-24 | Discharge: 2020-08-24 | Disposition: A | Discharge status: Home / Self Care | Payer: No Typology Code available for payment source | Source: Ambulatory Visit | Attending: Family Medicine | Admitting: Family Medicine

## 2020-08-24 DIAGNOSIS — O210 Mild hyperemesis gravidarum: Secondary | ICD-10-CM | POA: Diagnosis not present

## 2020-08-24 MED ORDER — THIAMINE HCL 100 MG/ML IJ SOLN
Freq: Once | INTRAVENOUS | Status: AC
  Filled 2020-08-24 (×2): qty 1000

## 2020-08-24 NOTE — Progress Notes (Signed)
Pt is here today for infusion. She has orders for Banana bag and also IVF with phenergan.  Pt states she wants the banana bag today to see if it helps and she wants to hold the phenergan infusion  for today because it makes her so sleepy and feel bad.

## 2020-08-25 ENCOUNTER — Other Ambulatory Visit: Payer: Self-pay | Admitting: Family Medicine

## 2020-08-25 NOTE — Addendum Note (Signed)
Addended by: Donnamae Jude on: 08/25/2020 04:56 PM   Modules accepted: Orders

## 2020-08-26 ENCOUNTER — Other Ambulatory Visit: Payer: Self-pay

## 2020-08-26 ENCOUNTER — Inpatient Hospital Stay (HOSPITAL_COMMUNITY): Admission: RE | Admit: 2020-08-26 | Payer: No Typology Code available for payment source | Source: Ambulatory Visit

## 2020-08-26 ENCOUNTER — Encounter (HOSPITAL_COMMUNITY)
Admission: RE | Admit: 2020-08-26 | Discharge: 2020-08-26 | Disposition: A | Discharge status: Home / Self Care | Payer: No Typology Code available for payment source | Source: Ambulatory Visit | Attending: Family Medicine | Admitting: Family Medicine

## 2020-08-26 DIAGNOSIS — O210 Mild hyperemesis gravidarum: Secondary | ICD-10-CM | POA: Diagnosis not present

## 2020-08-26 MED ORDER — THIAMINE HCL 100 MG/ML IJ SOLN
Freq: Once | INTRAVENOUS | Status: AC
  Filled 2020-08-26: qty 1000

## 2020-08-29 ENCOUNTER — Ambulatory Visit (HOSPITAL_COMMUNITY): Payer: No Typology Code available for payment source

## 2020-08-30 NOTE — Addendum Note (Signed)
Addended by: Donnamae Jude on: 08/30/2020 08:12 AM   Modules accepted: Orders

## 2020-08-31 ENCOUNTER — Encounter (HOSPITAL_COMMUNITY)
Admission: RE | Admit: 2020-08-31 | Discharge: 2020-08-31 | Disposition: A | Discharge status: Home / Self Care | Payer: No Typology Code available for payment source | Source: Ambulatory Visit | Attending: Family Medicine | Admitting: Family Medicine

## 2020-08-31 ENCOUNTER — Other Ambulatory Visit: Payer: Self-pay

## 2020-08-31 DIAGNOSIS — O211 Hyperemesis gravidarum with metabolic disturbance: Secondary | ICD-10-CM | POA: Insufficient documentation

## 2020-08-31 MED ORDER — THIAMINE HCL 100 MG/ML IJ SOLN
Freq: Once | INTRAVENOUS | Status: AC
  Filled 2020-08-31: qty 1000

## 2020-09-02 ENCOUNTER — Ambulatory Visit (HOSPITAL_COMMUNITY): Payer: No Typology Code available for payment source

## 2020-09-05 ENCOUNTER — Encounter (HOSPITAL_COMMUNITY): Admission: RE | Admit: 2020-09-05 | Payer: No Typology Code available for payment source | Source: Ambulatory Visit

## 2020-09-06 ENCOUNTER — Other Ambulatory Visit (HOSPITAL_COMMUNITY)
Admission: RE | Admit: 2020-09-06 | Discharge: 2020-09-06 | Disposition: A | Discharge status: Home / Self Care | Payer: No Typology Code available for payment source | Source: Ambulatory Visit | Attending: Family Medicine | Admitting: Family Medicine

## 2020-09-06 ENCOUNTER — Encounter: Payer: Self-pay | Admitting: Family Medicine

## 2020-09-06 ENCOUNTER — Other Ambulatory Visit: Payer: Self-pay

## 2020-09-06 ENCOUNTER — Ambulatory Visit (INDEPENDENT_AMBULATORY_CARE_PROVIDER_SITE_OTHER): Payer: Medicaid Other | Admitting: Family Medicine

## 2020-09-06 DIAGNOSIS — Z3480 Encounter for supervision of other normal pregnancy, unspecified trimester: Secondary | ICD-10-CM | POA: Diagnosis not present

## 2020-09-06 DIAGNOSIS — O09529 Supervision of elderly multigravida, unspecified trimester: Secondary | ICD-10-CM | POA: Insufficient documentation

## 2020-09-06 DIAGNOSIS — O09521 Supervision of elderly multigravida, first trimester: Secondary | ICD-10-CM | POA: Diagnosis not present

## 2020-09-06 NOTE — Patient Instructions (Signed)
First Trimester of Pregnancy The first trimester of pregnancy is from week 1 until the end of week 13 (months 1 through 3). A week after a sperm fertilizes an egg, the egg will implant on the wall of the uterus. This embryo will begin to develop into a baby. Genes from you and your partner will form the baby. The female genes will determine whether the baby will be a boy or a girl. At 6-8 weeks, the eyes and face will be formed, and the heartbeat can be seen on ultrasound. At the end of 12 weeks, all the baby's organs will be formed. Now that you are pregnant, you will want to do everything you can to have a healthy baby. Two of the most important things are to get good prenatal care and to follow your health care provider's instructions. Prenatal care is all the medical care you receive before the baby's birth. This care will help prevent, find, and treat any problems during the pregnancy and childbirth. Body changes during your first trimester Your body goes through many changes during pregnancy. The changes vary from woman to woman.  You may gain or lose a couple of pounds at first.  You may feel sick to your stomach (nauseous) and you may throw up (vomit). If the vomiting is uncontrollable, call your health care provider.  You may tire easily.  You may develop headaches that can be relieved by medicines. All medicines should be approved by your health care provider.  You may urinate more often. Painful urination may mean you have a bladder infection.  You may develop heartburn as a result of your pregnancy.  You may develop constipation because certain hormones are causing the muscles that push stool through your intestines to slow down.  You may develop hemorrhoids or swollen veins (varicose veins).  Your breasts may begin to grow larger and become tender. Your nipples may stick out more, and the tissue that surrounds them (areola) may become darker.  Your gums may bleed and may be  sensitive to brushing and flossing.  Dark spots or blotches (chloasma, mask of pregnancy) may develop on your face. This will likely fade after the baby is born.  Your menstrual periods will stop.  You may have a loss of appetite.  You may develop cravings for certain kinds of food.  You may have changes in your emotions from day to day, such as being excited to be pregnant or being concerned that something may go wrong with the pregnancy and baby.  You may have more vivid and strange dreams.  You may have changes in your hair. These can include thickening of your hair, rapid growth, and changes in texture. Some women also have hair loss during or after pregnancy, or hair that feels dry or thin. Your hair will most likely return to normal after your baby is born. What to expect at prenatal visits During a routine prenatal visit:  You will be weighed to make sure you and the baby are growing normally.  Your blood pressure will be taken.  Your abdomen will be measured to track your baby's growth.  The fetal heartbeat will be listened to between weeks 10 and 14 of your pregnancy.  Test results from any previous visits will be discussed. Your health care provider may ask you:  How you are feeling.  If you are feeling the baby move.  If you have had any abnormal symptoms, such as leaking fluid, bleeding, severe headaches, or abdominal   cramping.  If you are using any tobacco products, including cigarettes, chewing tobacco, and electronic cigarettes.  If you have any questions. Other tests that may be performed during your first trimester include:  Blood tests to find your blood type and to check for the presence of any previous infections. The tests will also be used to check for low iron levels (anemia) and protein on red blood cells (Rh antibodies). Depending on your risk factors, or if you previously had diabetes during pregnancy, you may have tests to check for high blood sugar  that affects pregnant women (gestational diabetes).  Urine tests to check for infections, diabetes, or protein in the urine.  An ultrasound to confirm the proper growth and development of the baby.  Fetal screens for spinal cord problems (spina bifida) and Down syndrome.  HIV (human immunodeficiency virus) testing. Routine prenatal testing includes screening for HIV, unless you choose not to have this test.  You may need other tests to make sure you and the baby are doing well. Follow these instructions at home: Medicines  Follow your health care provider's instructions regarding medicine use. Specific medicines may be either safe or unsafe to take during pregnancy.  Take a prenatal vitamin that contains at least 600 micrograms (mcg) of folic acid.  If you develop constipation, try taking a stool softener if your health care provider approves. Eating and drinking   Eat a balanced diet that includes fresh fruits and vegetables, whole grains, good sources of protein such as meat, eggs, or tofu, and low-fat dairy. Your health care provider will help you determine the amount of weight gain that is right for you.  Avoid raw meat and uncooked cheese. These carry germs that can cause birth defects in the baby.  Eating four or five small meals rather than three large meals a day may help relieve nausea and vomiting. If you start to feel nauseous, eating a few soda crackers can be helpful. Drinking liquids between meals, instead of during meals, also seems to help ease nausea and vomiting.  Limit foods that are high in fat and processed sugars, such as fried and sweet foods.  To prevent constipation: ? Eat foods that are high in fiber, such as fresh fruits and vegetables, whole grains, and beans. ? Drink enough fluid to keep your urine clear or pale yellow. Activity  Exercise only as directed by your health care provider. Most women can continue their usual exercise routine during  pregnancy. Try to exercise for 30 minutes at least 5 days a week. Exercising will help you: ? Control your weight. ? Stay in shape. ? Be prepared for labor and delivery.  Experiencing pain or cramping in the lower abdomen or lower back is a good sign that you should stop exercising. Check with your health care provider before continuing with normal exercises.  Try to avoid standing for long periods of time. Move your legs often if you must stand in one place for a long time.  Avoid heavy lifting.  Wear low-heeled shoes and practice good posture.  You may continue to have sex unless your health care provider tells you not to. Relieving pain and discomfort  Wear a good support bra to relieve breast tenderness.  Take warm sitz baths to soothe any pain or discomfort caused by hemorrhoids. Use hemorrhoid cream if your health care provider approves.  Rest with your legs elevated if you have leg cramps or low back pain.  If you develop varicose veins in   your legs, wear support hose. Elevate your feet for 15 minutes, 3-4 times a day. Limit salt in your diet. Prenatal care  Schedule your prenatal visits by the twelfth week of pregnancy. They are usually scheduled monthly at first, then more often in the last 2 months before delivery.  Write down your questions. Take them to your prenatal visits.  Keep all your prenatal visits as told by your health care provider. This is important. Safety  Wear your seat belt at all times when driving.  Make a list of emergency phone numbers, including numbers for family, friends, the hospital, and police and fire departments. General instructions  Ask your health care provider for a referral to a local prenatal education class. Begin classes no later than the beginning of month 6 of your pregnancy.  Ask for help if you have counseling or nutritional needs during pregnancy. Your health care provider can offer advice or refer you to specialists for help  with various needs.  Do not use hot tubs, steam rooms, or saunas.  Do not douche or use tampons or scented sanitary pads.  Do not cross your legs for long periods of time.  Avoid cat litter boxes and soil used by cats. These carry germs that can cause birth defects in the baby and possibly loss of the fetus by miscarriage or stillbirth.  Avoid all smoking, herbs, alcohol, and medicines not prescribed by your health care provider. Chemicals in these products affect the formation and growth of the baby.  Do not use any products that contain nicotine or tobacco, such as cigarettes and e-cigarettes. If you need help quitting, ask your health care provider. You may receive counseling support and other resources to help you quit.  Schedule a dentist appointment. At home, brush your teeth with a soft toothbrush and be gentle when you floss. Contact a health care provider if:  You have dizziness.  You have mild pelvic cramps, pelvic pressure, or nagging pain in the abdominal area.  You have persistent nausea, vomiting, or diarrhea.  You have a bad smelling vaginal discharge.  You have pain when you urinate.  You notice increased swelling in your face, hands, legs, or ankles.  You are exposed to fifth disease or chickenpox.  You are exposed to German measles (rubella) and have never had it. Get help right away if:  You have a fever.  You are leaking fluid from your vagina.  You have spotting or bleeding from your vagina.  You have severe abdominal cramping or pain.  You have rapid weight gain or loss.  You vomit blood or material that looks like coffee grounds.  You develop a severe headache.  You have shortness of breath.  You have any kind of trauma, such as from a fall or a car accident. Summary  The first trimester of pregnancy is from week 1 until the end of week 13 (months 1 through 3).  Your body goes through many changes during pregnancy. The changes vary from  woman to woman.  You will have routine prenatal visits. During those visits, your health care provider will examine you, discuss any test results you may have, and talk with you about how you are feeling. This information is not intended to replace advice given to you by your health care provider. Make sure you discuss any questions you have with your health care provider. Document Revised: 09/27/2017 Document Reviewed: 09/26/2016 Elsevier Patient Education  2020 [employer]  

## 2020-09-06 NOTE — Progress Notes (Signed)
Subjective:   Bridget Walters is a 34 y.o. G3P2002 at [redacted]w[redacted]d by best clinical estimate being seen today for her first obstetrical visit.  Her obstetrical history is significant for advanced maternal age.  Pregnancy history fully reviewed.  Patient reports nausea. Is improving  HISTORY: OB History  Gravida Para Term Preterm AB Living  3 2 2  0 0 2  SAB TAB Ectopic Multiple Live Births  0 0 0 0 2    # Outcome Date GA Lbr Len/2nd Weight Sex Delivery Anes PTL Lv  3 Current           2 Term 09/04/12 [redacted]w[redacted]d  7 lb 10.9 oz (3.485 kg) M Vag-Spont EPI  LIV     Birth Comments: None     Name: Mcclaren,BOY Cherica     Apgar1: 8  Apgar5: 9  1 Term 09/2010 [redacted]w[redacted]d  8 lb 10 oz (3.912 kg) M Vag-Forceps EPI  LIV   Last pap smear was  2020 and was normal Past Medical History:  Diagnosis Date   Atypical mole 02/20/2016   R lower back sup/mild-mod, R lower back inf/mild-mod   Atypical mole 03/17/2018   R lateral ankle/mild, R buttock/mild, L lateral hip/mild   history of blood in stool    History of chicken pox    History of fainting    History of hay fever    History of headache    History of migraine    History of urinary incontinence    History of UTI    Past Surgical History:  Procedure Laterality Date   NO PAST SURGERIES     Family History  Problem Relation Age of Onset   Diabetes Father    Cancer Father        skin cancer   Hypertension Father    Kidney disease Father        had Kidney transplant   Diabetes Maternal Grandmother    Arthritis Maternal Grandmother    Diabetes Maternal Grandfather    Hyperlipidemia Paternal Grandmother    Cancer Paternal Grandmother    Lung cancer Paternal Grandmother    Prostate cancer Paternal Grandfather    Diabetes Paternal Grandfather    Anesthesia problems Neg Hx    Other Neg Hx    Social History   Tobacco Use   Smoking status: Never Smoker   Smokeless tobacco: Never Used  Scientific laboratory technician Use: Never  used  Substance Use Topics   Alcohol use: No   Drug use: No   Allergies  Allergen Reactions   Minocycline Hcl Hives   Sumatriptan Nausea Only   Current Outpatient Medications on File Prior to Visit  Medication Sig Dispense Refill   cetirizine (ZYRTEC ALLERGY) 10 MG tablet Take 1 tablet (10 mg total) by mouth daily. (Patient not taking: Reported on 09/06/2020) 30 tablet 0   cyclobenzaprine (FLEXERIL) 10 MG tablet Take 1 tablet (10 mg total) by mouth 3 (three) times daily as needed for muscle spasms. For muscle pain , use with caution of sedation (Patient not taking: Reported on 09/06/2020) 20 tablet 0   doxylamine, Sleep, (UNISOM) 25 MG tablet Take 25 mg by mouth at bedtime as needed. (Patient not taking: Reported on 09/06/2020)     Doxylamine-Pyridoxine (DICLEGIS) 10-10 MG TBEC Take 2 tablets by mouth at bedtime. If symptoms persist, add one tablet in the morning and one in the afternoon (Patient not taking: Reported on 09/06/2020) 100 tablet 5   metoCLOPramide (REGLAN)  10 MG tablet Take 1 tablet (10 mg total) by mouth 4 (four) times daily. (Patient not taking: Reported on 09/06/2020) 120 tablet 1   Multiple Vitamin (MULTIVITAMIN) tablet Take 1 tablet by mouth daily. (Patient not taking: Reported on 09/06/2020)     norethindrone (MICRONOR) 0.35 MG tablet Take 1 tablet (0.35 mg total) by mouth daily. (Patient not taking: Reported on 09/06/2020) 3 Package 5   ondansetron (ZOFRAN ODT) 4 MG disintegrating tablet Take 1 tablet (4 mg total) by mouth every 6 (six) hours as needed for nausea. (Patient not taking: Reported on 09/06/2020) 20 tablet 0   promethazine (PHENERGAN) 25 MG tablet Take 1 tablet (25 mg total) by mouth every 6 (six) hours as needed for nausea or vomiting. (Patient not taking: Reported on 09/06/2020) 30 tablet 2   scopolamine (TRANSDERM-SCOP, 1.5 MG,) 1 MG/3DAYS Place 1 patch (1.5 mg total) onto the skin every 3 (three) days. (Patient not taking: Reported on 09/06/2020) 10 patch  12   vitamin B-6 (PYRIDOXINE) 25 MG tablet Take 25 mg by mouth every 6 (six) hours. (Patient not taking: Reported on 09/06/2020)     No current facility-administered medications on file prior to visit.     Exam   Vitals:   09/06/20 0920  BP: 125/76  Pulse: (!) 116  Weight: 119 lb (54 kg)   Fetal Heart Rate (bpm): 162  System: General: well-developed, well-nourished female in no acute distress   Skin: normal coloration and turgor, no rashes   Neurologic: oriented, normal, negative, normal mood   Extremities: normal strength, tone, and muscle mass, ROM of all joints is normal   HEENT PERRLA, extraocular movement intact and sclera clear, anicteric   Mouth/Teeth mucous membranes moist, pharynx normal without lesions and dental hygiene good   Neck supple and no masses   Cardiovascular: regular rate and rhythm   Respiratory:  no respiratory distress, normal breath sounds   Abdomen: soft, non-tender; bowel sounds normal; no masses,  no organomegaly     Assessment:   Pregnancy: [id number] Patient Active Problem List   Diagnosis Date Noted   Supervision of other normal pregnancy, antepartum 09/06/2020   AMA (advanced maternal age) multigravida 35+ 09/06/2020   Acute bronchitis 09/09/2018   Routine general medical examination at a health care facility 02/13/2017   Underweight 02/13/2017   Pain, joint, multiple sites 01/19/2014   Back pain 01/19/2014   Migraine headache 01/19/2014     Plan:  1. Supervision of other normal pregnancy, antepartum Labs and problem list updated Discussed risks Declines flu and COVID vaccine - CBC/D/Plt+RPR+Rh+ABO+Rub Ab... - Culture, OB Urine - Genetic Screening - GC/Chlamydia probe amp ([place])not at [facility] - Babyscripts Schedule Optimization  2. Multigravida of advanced maternal age in first trimester NIPT testing--discussed further genetics if abnl - Hemoglobin A1c   Initial labs drawn. Continue prenatal vitamins. Genetic  Screening discussed, NIPS: ordered. Ultrasound discussed; fetal anatomic survey: ordered. Problem list reviewed and updated.  Routine obstetric precautions reviewed. Return in about 4 weeks (around 10/04/2020).

## 2020-09-06 NOTE — Progress Notes (Signed)
DATING AND VIABILITY SONOGRAM   Bridget Walters is a 34 y.o. year old G67P2002 with LMP Patient's last menstrual period was 06/23/2020. which would correlate to  [redacted]w[redacted]d weeks gestation.  She has regular menstrual cycles.   She is here today for a confirmatory initial sonogram.    GESTATION: SINGLETON yes     FETAL ACTIVITY:          Heart rate         162          The fetus is active.   GESTATIONAL AGE AND  BIOMETRICS:  Gestational criteria: Estimated Date of Delivery: 03/30/21 by LMP now at [redacted]w[redacted]d  Previous Scans:0  GESTATIONAL SAC            mm          weeks  CROWN RUMP LENGTH           4.26 cm         11.0 weeks                                                   AVERAGE EGA(BY THIS SCAN):  11.0 weeks  WORKING EDD( LMP ):  03/30/2021     TECHNICIAN COMMENTS: Patient informed that the ultrasound is considered a limited obstetric ultrasound and is not intended to be a complete ultrasound exam. Patient also informed that the ultrasound is not being completed with the intent of assessing for fetal or placental anomalies or any pelvic abnormalities. Explained that the purpose of today's ultrasound is to assess for fetal heart rate. Patient acknowledges the purpose of the exam and the limitations of the study.        A copy of this report including all images has been saved and backed up to a second source for retrieval if needed. All measures and details of the anatomical scan, placentation, fluid volume and pelvic anatomy are contained in that report.  Crosby Oyster 09/06/2020 9:43 AM

## 2020-09-07 LAB — CBC/D/PLT+RPR+RH+ABO+RUB AB...
Antibody Screen: NEGATIVE
Basophils Absolute: 0 10*3/uL (ref 0.0–0.2)
Basos: 0 %
EOS (ABSOLUTE): 0 10*3/uL (ref 0.0–0.4)
Eos: 1 %
HCV Ab: <0.1 s/co ratio (ref 0.0–0.9)
HIV Screen 4th Generation wRfx: NONREACTIVE
Hematocrit: 39.3 % (ref 34.0–46.6)
Hemoglobin: 13.3 g/dL (ref 11.1–15.9)
Hepatitis B Surface Ag: NEGATIVE
Immature Grans (Abs): 0.1 10*3/uL (ref 0.0–0.1)
Immature Granulocytes: 1 %
Lymphocytes Absolute: 1.5 10*3/uL (ref 0.7–3.1)
Lymphs: 17 %
MCH: 29.8 pg (ref 26.6–33.0)
MCHC: 33.8 g/dL (ref 31.5–35.7)
MCV: 88 fL (ref 79–97)
Monocytes Absolute: 0.5 10*3/uL (ref 0.1–0.9)
Monocytes: 6 %
Neutrophils Absolute: 6.7 10*3/uL (ref 1.4–7.0)
Neutrophils: 75 %
Platelets: 352 10*3/uL (ref 150–450)
RBC: 4.46 x10E6/uL (ref 3.77–5.28)
RDW: 12.5 % (ref 11.7–15.4)
RPR Ser Ql: NONREACTIVE
Rh Factor: POSITIVE
Rubella Antibodies, IGG: 4.28 index (ref >0.99)
WBC: 8.8 10*3/uL (ref 3.4–10.8)

## 2020-09-07 LAB — GC/CHLAMYDIA PROBE AMP (~~LOC~~) NOT AT ARMC
Chlamydia: NEGATIVE
Comment: NEGATIVE
Comment: NORMAL
Neisseria Gonorrhea: NEGATIVE

## 2020-09-07 LAB — HEMOGLOBIN A1C
Est. average glucose Bld gHb Est-mCnc: 100 mg/dL
Hgb A1c MFr Bld: 5.1 % (ref 4.8–5.6)

## 2020-09-07 LAB — HCV INTERPRETATION

## 2020-09-08 LAB — CULTURE, OB URINE

## 2020-09-08 LAB — URINE CULTURE, OB REFLEX: Organism ID, Bacteria: NO GROWTH

## 2020-09-12 ENCOUNTER — Encounter: Payer: Self-pay | Admitting: *Deleted

## 2020-09-12 ENCOUNTER — Telehealth: Payer: Self-pay | Admitting: *Deleted

## 2020-09-12 NOTE — Telephone Encounter (Signed)
Pt informed of panorama results  ?

## 2020-09-15 ENCOUNTER — Encounter: Payer: Self-pay | Admitting: Radiology

## 2020-10-04 ENCOUNTER — Other Ambulatory Visit: Payer: Self-pay

## 2020-10-04 ENCOUNTER — Ambulatory Visit (INDEPENDENT_AMBULATORY_CARE_PROVIDER_SITE_OTHER): Payer: No Typology Code available for payment source | Admitting: Family Medicine

## 2020-10-04 VITALS — BP 134/80 | HR 116 | Wt 127.0 lb

## 2020-10-04 DIAGNOSIS — O09521 Supervision of elderly multigravida, first trimester: Secondary | ICD-10-CM

## 2020-10-04 DIAGNOSIS — K219 Gastro-esophageal reflux disease without esophagitis: Secondary | ICD-10-CM

## 2020-10-04 DIAGNOSIS — Z3480 Encounter for supervision of other normal pregnancy, unspecified trimester: Secondary | ICD-10-CM

## 2020-10-04 MED ORDER — PANTOPRAZOLE SODIUM 20 MG PO TBEC: 20.0000 mg | DELAYED_RELEASE_TABLET | Freq: Every day | ORAL | 2 refills | Status: DC

## 2020-10-04 NOTE — Progress Notes (Signed)
    PRENATAL VISIT NOTE  Subjective:  Bridget Walters is a 34 y.o. G3P2002 at [redacted]w[redacted]d being seen today for ongoing prenatal care.  She is currently monitored for the following issues for this high-risk pregnancy and has Pain, joint, multiple sites; Back pain; Migraine headache; Routine general medical examination at a health care facility; Underweight; Acute bronchitis; Supervision of other normal pregnancy, antepartum; and AMA (advanced maternal age) multigravida 35+ on their problem list.  Patient reports heartburn, nausea and vomiting.  Contractions: Not present. Vag. Bleeding: None.   . Denies leaking of fluid.   The following portions of the patient's history were reviewed and updated as appropriate: allergies, current medications, past family history, past medical history, past social history, past surgical history and problem list.   Objective:   Vitals:   10/04/20 1103  BP: 134/80  Pulse: (!) 116  Weight: 127 lb (57.6 kg)    Fetal Status: Fetal Heart Rate (bpm): 145         General:  Alert, oriented and cooperative. Patient is in no acute distress.  Skin: Skin is warm and dry. No rash noted.   Cardiovascular: Normal heart rate noted  Respiratory: Normal respiratory effort, no problems with respiration noted  Abdomen: Soft, gravid, appropriate for gestational age.  Pain/Pressure: Present     Pelvic: Cervical exam deferred        Extremities: Normal range of motion.  Edema: Mild pitting, slight indentation  Mental Status: Normal mood and affect. Normal behavior. Normal judgment and thought content.   Assessment and Plan:  Pregnancy: G3P2002 at [redacted]w[redacted]d 1. Supervision of other normal pregnancy, antepartum Anatomy u/s is needed, improved N/V--weight has gone up. - [place] MFM OB DETAIL +14 WK; Future  2. Multigravida of advanced maternal age in first trimester Detailed due to AMA--normal NIPT - [place] MFM OB DETAIL +14 WK; Future  3. Gastroesophageal reflux disease without  esophagitis Trial of PPI a few times/week - pantoprazole (PROTONIX) 20 MG tablet; Take 1 tablet (20 mg total) by mouth daily.  Dispense: 45 tablet; Refill: 2  Preterm labor symptoms and general obstetric precautions including but not limited to vaginal bleeding, contractions, leaking of fluid and fetal movement were reviewed in detail with the patient. Please refer to After Visit Summary for other counseling recommendations.   Return in 4 weeks (on 11/01/2020).  Future Appointments  Date Time Provider [place]  11/01/2020  9:30 AM Bridget Jude, MD [facility]  11/08/2020 10:00 AM [facility] NURSE [facility]  11/08/2020 10:15 AM [facility] US2 [facility]  03/21/2021 10:30 AM Brendolyn Patty, MD [facility] None    Bridget Jude, MD

## 2020-10-04 NOTE — Patient Instructions (Signed)

## 2020-10-04 NOTE — Progress Notes (Signed)
Would like med for heartburn

## 2020-10-29 NOTE — L&D Delivery Note (Signed)
Delivery Note At 1125 a viable female infant was delivered via SVD, presentation: LOA. APGAR: 9, 10; weight pending.   Placenta status: spontaneously delivered intact with gentle cord traction. Fundus firm with massage and Pitocin initially then brisk bleeding noted. Methergine given and fundal massage continued and bleeding slowed but still with periods of brisk gushes, TXA given and with fundal massage uterus became firm and bleeding slowed.  Anesthesia: epidural Lacerations: 2nd degree perineal Suture used for repair: 2-0 Vicryl rapide Est. Blood Loss (mL): 275 Placenta to LD Complications none Cord ph n/a   Mom to postpartum. Baby to Couplet care / Skin to Skin.    Julianne Handler, CNM 03/09/2021 11:56 AM

## 2020-11-01 ENCOUNTER — Ambulatory Visit (INDEPENDENT_AMBULATORY_CARE_PROVIDER_SITE_OTHER): Payer: Medicaid Other | Admitting: Family Medicine

## 2020-11-01 ENCOUNTER — Other Ambulatory Visit: Payer: Self-pay

## 2020-11-01 VITALS — BP 113/76 | HR 120 | Wt 130.0 lb

## 2020-11-01 DIAGNOSIS — O09522 Supervision of elderly multigravida, second trimester: Secondary | ICD-10-CM

## 2020-11-01 DIAGNOSIS — R002 Palpitations: Secondary | ICD-10-CM

## 2020-11-01 DIAGNOSIS — Z3480 Encounter for supervision of other normal pregnancy, unspecified trimester: Secondary | ICD-10-CM

## 2020-11-01 NOTE — Patient Instructions (Signed)

## 2020-11-01 NOTE — Progress Notes (Signed)
    PRENATAL VISIT NOTE  Subjective:  Bridget Walters is a 35 y.o. G3P2002 at [redacted]w[redacted]d being seen today for ongoing prenatal care.  She is currently monitored for the following issues for this high-risk pregnancy and has Pain, joint, multiple sites; Back pain; Migraine headache; Routine general medical examination at a health care facility; Underweight; Supervision of other normal pregnancy, antepartum; and AMA (advanced maternal age) multigravida 35+ on their problem list.  Patient reports rapid HR associated with SOB from time to time. Reports abdominal bloating after dinner, eliminated meat. Happens every pm, improved with rest..  Contractions: Not present. Vag. Bleeding: None.  Movement: Present. Denies leaking of fluid.   The following portions of the patient's history were reviewed and updated as appropriate: allergies, current medications, past family history, past medical history, past social history, past surgical history and problem list.   Objective:   Vitals:   11/01/20 0937  BP: 113/76  Pulse: (!) 120  Weight: 130 lb (59 kg)    Fetal Status: Fetal Heart Rate (bpm): 143   Movement: Present     General:  Alert, oriented and cooperative. Patient is in no acute distress.  Skin: Skin is warm and dry. No rash noted.   Cardiovascular: Normal heart rate noted  Respiratory: Normal respiratory effort, no problems with respiration noted  Abdomen: Soft, gravid, appropriate for gestational age.  Pain/Pressure: Present     Pelvic: Cervical exam performed in the presence of a chaperone Dilation: Closed Effacement (%): Thick Station: Ballotable  Extremities: Normal range of motion.  Edema: None  Mental Status: Normal mood and affect. Normal behavior. Normal judgment and thought content.   Assessment and Plan:  Pregnancy: G3P2002 at [redacted]w[redacted]d 1. Supervision of other normal pregnancy, antepartum For anatomy u/s on 11/08/20. No lab person today--AFP if needed next visit. Trial food  elimination and consider GI referral if no improvement in gastric bloating. Thinks she lost her mucous plug--som lower pelvic pressure-->Cx is unlabored, reassurance given--maternity belt Has some varicose veins which might be contributing.  2. Multigravida of advanced maternal age in second trimester Normal NIPT  3. Palpitations Sounds like SVT--ways to slow the HR reviewed, will send to cardiology - Ambulatory referral to Cardiology  General obstetric precautions including but not limited to vaginal bleeding, contractions, leaking of fluid and fetal movement were reviewed in detail with the patient. Please refer to After Visit Summary for other counseling recommendations.   Return in 4 weeks (on 11/29/2020) for in person, [facility].  Future Appointments  Date Time Provider Department Center  11/08/2020 10:00 AM [facility] NURSE [facility]  11/08/2020 10:15 AM [facility] US2 [facility]  11/29/2020 10:45 AM Reva Bores, MD [facility]  03/21/2021 10:30 AM Willeen Niece, MD [facility] None    Reva Bores, MD

## 2020-11-01 NOTE — Progress Notes (Signed)
Pt HR has ben fast for past 2 weeks and having trouble catching her breath

## 2020-11-08 ENCOUNTER — Other Ambulatory Visit: Payer: Self-pay

## 2020-11-08 ENCOUNTER — Other Ambulatory Visit: Payer: Self-pay | Admitting: *Deleted

## 2020-11-08 ENCOUNTER — Ambulatory Visit: Payer: Medicaid Other | Attending: Family Medicine

## 2020-11-08 ENCOUNTER — Encounter: Payer: Self-pay | Admitting: *Deleted

## 2020-11-08 ENCOUNTER — Ambulatory Visit: Payer: Medicaid Other | Admitting: *Deleted

## 2020-11-08 DIAGNOSIS — O09521 Supervision of elderly multigravida, first trimester: Secondary | ICD-10-CM | POA: Diagnosis present

## 2020-11-08 DIAGNOSIS — Z3480 Encounter for supervision of other normal pregnancy, unspecified trimester: Secondary | ICD-10-CM | POA: Diagnosis present

## 2020-11-08 DIAGNOSIS — O09522 Supervision of elderly multigravida, second trimester: Secondary | ICD-10-CM

## 2020-11-21 ENCOUNTER — Other Ambulatory Visit: Payer: Self-pay

## 2020-11-21 ENCOUNTER — Encounter: Payer: Self-pay | Admitting: Cardiology

## 2020-11-21 ENCOUNTER — Ambulatory Visit (INDEPENDENT_AMBULATORY_CARE_PROVIDER_SITE_OTHER): Payer: Medicaid Other

## 2020-11-21 ENCOUNTER — Ambulatory Visit (INDEPENDENT_AMBULATORY_CARE_PROVIDER_SITE_OTHER): Payer: Medicaid Other | Admitting: Cardiology

## 2020-11-21 VITALS — BP 110/66 | HR 94 | Ht 67.0 in | Wt 138.0 lb

## 2020-11-21 DIAGNOSIS — Z3A21 21 weeks gestation of pregnancy: Secondary | ICD-10-CM

## 2020-11-21 DIAGNOSIS — R1011 Right upper quadrant pain: Secondary | ICD-10-CM

## 2020-11-21 DIAGNOSIS — R002 Palpitations: Secondary | ICD-10-CM

## 2020-11-21 DIAGNOSIS — R0600 Dyspnea, unspecified: Secondary | ICD-10-CM

## 2020-11-21 DIAGNOSIS — R0609 Other forms of dyspnea: Secondary | ICD-10-CM

## 2020-11-21 NOTE — Progress Notes (Signed)
Cardiology Office Note:    Date:  11/21/2020   ID:  Bridget Walters, DOB 1986/02/07, MRN [id number]  PCP:  Abner Greenspan, MD  [place] Cardiologist:  No primary care provider on file.  [facility] Electrophysiologist:  None   Referring MD: Donnamae Jude, MD   Chief Complaint  Patient presents with  . NEW patient-Referred by OBGYN for eval of palpitations/SOB    Patient is [redacted] weeks pregnant.    History of Present Illness:    Bridget Walters is a 35 y.o. female with no significant past medical history, currently [redacted] weeks pregnant who presents due to palpitations and shortness of breath.  She states having symptoms of palpitations , over the past several weeks.  Followed up with OB/GYN who noted heart rates up to 120 bpm.  Also endorses shortness of breath with exertion which have become more noticeable with pregnancy.  Denies any history of heart disease, denies smoking.  Palpitations are associated with dizziness, presyncope or syncope.  Past Medical History:  Diagnosis Date  . Atypical mole 02/20/2016   R lower back sup/mild-mod, R lower back inf/mild-mod  . Atypical mole 03/17/2018   R lateral ankle/mild, R buttock/mild, L lateral hip/mild  . history of blood in stool   . History of chicken pox   . History of fainting   . History of hay fever   . History of headache   . History of migraine   . History of urinary incontinence   . History of UTI     Past Surgical History:  Procedure Laterality Date  . NO PAST SURGERIES      Current Medications: Current Meds  Medication Sig  . cyclobenzaprine (FLEXERIL) 10 MG tablet Take 1 tablet (10 mg total) by mouth 3 (three) times daily as needed for muscle spasms. For muscle pain , use with caution of sedation  . doxylamine, Sleep, (UNISOM) 25 MG tablet Take 25 mg by mouth at bedtime as needed.  . pantoprazole (PROTONIX) 20 MG tablet Take 20 mg by mouth daily as needed.  . Prenatal Vit-Fe Fumarate-FA  (MULTIVITAMIN-PRENATAL) 27-0.8 MG TABS tablet Take 1 tablet by mouth daily at 12 noon.     Allergies:   Minocycline hcl and Sumatriptan   Social History   Socioeconomic History  . Marital status: Married    Spouse name: Not on file  . Number of children: Not on file  . Years of education: Not on file  . Highest education level: Not on file  Occupational History  . Not on file  Tobacco Use  . Smoking status: Never Smoker  . Smokeless tobacco: Never Used  Vaping Use  . Vaping Use: Never used  Substance and Sexual Activity  . Alcohol use: No  . Drug use: No  . Sexual activity: Yes  Other Topics Concern  . Not on file  Social History Narrative   3 children    Stays home    Also cares for newborn niece (4/18)   Social Determinants of Health   Financial Resource Strain: Not on file  Food Insecurity: Not on file  Transportation Needs: Not on file  Physical Activity: Not on file  Stress: Not on file  Social Connections: Not on file     Family History: The patient's family history includes Arthritis in her maternal grandmother; Cancer in her father and paternal grandmother; Diabetes in her father, maternal grandfather, maternal grandmother, and paternal grandfather; Hyperlipidemia in her paternal grandmother; Hypertension in her  father; Kidney disease in her father; Lung cancer in her paternal grandmother; Prostate cancer in her paternal grandfather. There is no history of Anesthesia problems or Other.  ROS:   Please see the history of present illness.     All other systems reviewed and are negative.  EKGs/Labs/Other Studies Reviewed:    The following studies were reviewed today:   EKG:  EKG is  ordered today.  The ekg ordered today demonstrates normal sinus rhythm, normal ECG  Recent Labs: 08/15/2020: ALT 14; BUN 20; Creatinine, Ser 0.70; Potassium 3.8; Sodium 134 09/06/2020: Hemoglobin 13.3; Platelets 352  Recent Lipid Panel    Component Value Date/Time   CHOL 183  02/13/2017 1516   TRIG 48.0 02/13/2017 1516   HDL 69.30 02/13/2017 1516   CHOLHDL 3 02/13/2017 1516   VLDL 9.6 02/13/2017 1516   LDLCALC 104 (H) 02/13/2017 1516     Risk Assessment/Calculations:      Physical Exam:    VS:  BP 110/66 (BP Location: Right Arm, Patient Position: Sitting, Cuff Size: Normal)   Pulse 94   Ht 5\' 7"  (1.702 m)   Wt 138 lb (62.6 kg)   LMP 06/23/2020   SpO2 99%   BMI 21.61 kg/m     Wt Readings from Last 3 Encounters:  11/21/20 138 lb (62.6 kg)  11/01/20 130 lb (59 kg)  10/04/20 127 lb (57.6 kg)     GEN:  Well nourished, well developed in no acute distress HEENT: Normal NECK: No JVD; No carotid bruits LYMPHATICS: No lymphadenopathy CARDIAC: RRR, no murmurs, rubs, gallops RESPIRATORY:  Clear to auscultation without rales, wheezing or rhonchi  ABDOMEN: Soft, non-tender, non-distended MUSCULOSKELETAL:  No edema; No deformity  SKIN: Warm and dry NEUROLOGIC:  Alert and oriented x 3 PSYCHIATRIC:  Normal affect   ASSESSMENT:    1. Palpitations   2. Dyspnea on exertion   3. [redacted] weeks gestation of pregnancy    PLAN:    In order of problems listed above:  1. Palpitations, place a 2-week cardiac monitor to evaluate any significant arrhythmias. 2. Dyspnea on exertion, could be related to pregnancy, get echo to rule out any cardiac dysfunction. 3. [redacted] weeks pregnant, management as per OB/GYN.  Follow-up after echo and cardiac monitor.     Medication Adjustments/Labs and Tests Ordered: Current medicines are reviewed at length with the patient today.  Concerns regarding medicines are outlined above.  Orders Placed This Encounter  Procedures  . LONG TERM MONITOR (3-14 DAYS)  . EKG 12-Lead  . ECHOCARDIOGRAM COMPLETE   No orders of the defined types were placed in this encounter.   Patient Instructions  Medication Instructions:  Your physician recommends that you continue on your current medications as directed. Please refer to the Current  Medication list given to you today.  *If you need a refill on your cardiac medications before your next appointment, please call your pharmacy*   Lab Work: None ordered  If you have labs (blood work) drawn today and your tests are completely normal, you will receive your results only by: Marland Kitchen MyChart Message (if you have MyChart) OR . A paper copy in the mail If you have any lab test that is abnormal or we need to change your treatment, we will call you to review the results.   Testing/Procedures: Your physician has requested that you have an echocardiogram. Echocardiography is a painless test that uses sound waves to create images of your heart. It provides your doctor with information about the  size and shape of your heart and how well your heart's chambers and valves are working. This procedure takes approximately one hour. There are no restrictions for this procedure.  Your physician has recommended that you wear a Zio monitor for 2 weeks. This monitor is a medical device that records the heart's electrical activity. Doctors most often use these monitors to diagnose arrhythmias. Arrhythmias are problems with the speed or rhythm of the heartbeat. The monitor is a small device applied to your chest. You can wear one while you do your normal daily activities. While wearing this monitor if you have any symptoms to push the button and record what you felt. Once you have worn this monitor for the period of time provider prescribed (Usually 14 days), you will return the monitor device in the postage paid box. Once it is returned they will download the data collected and provide [place] with a report which the provider will then review and we will call you with those results. Important tips:  1. Avoid showering during the first 24 hours of wearing the monitor. 2. Avoid excessive sweating to help maximize wear time. 3. Do not submerge the device, no hot tubs, and no swimming pools. 4. Keep any lotions or  oils away from the patch. 5. After 24 hours you may shower with the patch on. Take brief showers with your back facing the shower head.  6. Do not remove patch once it has been placed because that will interrupt data and decrease adhesive wear time. 7. Push the button when you have any symptoms and write down what you were feeling. 8. Once you have completed wearing your monitor, remove and place into box which has postage paid and place in your outgoing mailbox.  9. If for some reason you have misplaced your box then call our office and we can provide another box and/or mail it off for you.       Follow-Up: At [facility], you and your health needs are our priority.  As part of our continuing mission to provide you with exceptional heart care, we have created designated Provider Care Teams.  These Care Teams include your primary Cardiologist (physician) and Advanced Practice Providers (APPs -  Physician Assistants and Nurse Practitioners) who all work together to provide you with the care you need, when you need it.  We recommend signing up for the patient portal called "MyChart".  Sign up information is provided on this After Visit Summary.  MyChart is used to connect with patients for Virtual Visits (Telemedicine).  Patients are able to view lab/test results, encounter notes, upcoming appointments, etc.  Non-urgent messages can be sent to your provider as well.   To learn more about what you can do with MyChart, go to [email or web address].    Your next appointment:   F/U in 6 weeks (after testing)  The format for your next appointment:   In Person  Provider:   You may see Dr. Garen Lah or one of the following Advanced Practice Providers on your designated Care Team:    Murray Hodgkins, NP  Christell Faith, PA-C  Marrianne Mood, PA-C  Cadence Cole, [place]  Laurann Montana, NP     Signed, Kate Sable, MD  11/21/2020 1:13 PM    [place]

## 2020-11-21 NOTE — Patient Instructions (Addendum)
Medication Instructions:  Your physician recommends that you continue on your current medications as directed. Please refer to the Current Medication list given to you today.  *If you need a refill on your cardiac medications before your next appointment, please call your pharmacy*   Lab Work: None ordered  If you have labs (blood work) drawn today and your tests are completely normal, you will receive your results only by: Marland Kitchen MyChart Message (if you have MyChart) OR . A paper copy in the mail If you have any lab test that is abnormal or we need to change your treatment, we will call you to review the results.   Testing/Procedures: Your physician has requested that you have an echocardiogram. Echocardiography is a painless test that uses sound waves to create images of your heart. It provides your doctor with information about the size and shape of your heart and how well your heart's chambers and valves are working. This procedure takes approximately one hour. There are no restrictions for this procedure.  Your physician has recommended that you wear a Zio monitor for 2 weeks. This monitor is a medical device that records the heart's electrical activity. Doctors most often use these monitors to diagnose arrhythmias. Arrhythmias are problems with the speed or rhythm of the heartbeat. The monitor is a small device applied to your chest. You can wear one while you do your normal daily activities. While wearing this monitor if you have any symptoms to push the button and record what you felt. Once you have worn this monitor for the period of time provider prescribed (Usually 14 days), you will return the monitor device in the postage paid box. Once it is returned they will download the data collected and provide [place] with a report which the provider will then review and we will call you with those results. Important tips:  1. Avoid showering during the first 24 hours of wearing the monitor. 2. Avoid  excessive sweating to help maximize wear time. 3. Do not submerge the device, no hot tubs, and no swimming pools. 4. Keep any lotions or oils away from the patch. 5. After 24 hours you may shower with the patch on. Take brief showers with your back facing the shower head.  6. Do not remove patch once it has been placed because that will interrupt data and decrease adhesive wear time. 7. Push the button when you have any symptoms and write down what you were feeling. 8. Once you have completed wearing your monitor, remove and place into box which has postage paid and place in your outgoing mailbox.  9. If for some reason you have misplaced your box then call our office and we can provide another box and/or mail it off for you.       Follow-Up: At [facility], you and your health needs are our priority.  As part of our continuing mission to provide you with exceptional heart care, we have created designated Provider Care Teams.  These Care Teams include your primary Cardiologist (physician) and Advanced Practice Providers (APPs -  Physician Assistants and Nurse Practitioners) who all work together to provide you with the care you need, when you need it.  We recommend signing up for the patient portal called "MyChart".  Sign up information is provided on this After Visit Summary.  MyChart is used to connect with patients for Virtual Visits (Telemedicine).  Patients are able to view lab/test results, encounter notes, upcoming appointments, etc.  Non-urgent messages can  be sent to your provider as well.   To learn more about what you can do with MyChart, go to [email or web address].    Your next appointment:   F/U in 6 weeks (after testing)  The format for your next appointment:   In Person  Provider:   You may see Dr. Garen Lah or one of the following Advanced Practice Providers on your designated Care Team:    Murray Hodgkins, NP  Christell Faith, PA-C  Marrianne Mood,  PA-C  Cadence [place], [place]  Laurann Montana, NP

## 2020-11-28 ENCOUNTER — Other Ambulatory Visit: Payer: Self-pay | Admitting: Family Medicine

## 2020-11-28 ENCOUNTER — Ambulatory Visit
Admission: RE | Admit: 2020-11-28 | Discharge: 2020-11-28 | Disposition: A | Discharge status: Home / Self Care | Payer: Medicaid Other | Source: Ambulatory Visit | Attending: Family Medicine | Admitting: Family Medicine

## 2020-11-28 ENCOUNTER — Other Ambulatory Visit: Payer: Self-pay

## 2020-11-28 DIAGNOSIS — R1011 Right upper quadrant pain: Secondary | ICD-10-CM | POA: Diagnosis present

## 2020-11-29 ENCOUNTER — Encounter: Payer: Medicaid Other | Admitting: Family Medicine

## 2020-11-30 ENCOUNTER — Ambulatory Visit (INDEPENDENT_AMBULATORY_CARE_PROVIDER_SITE_OTHER): Payer: Medicaid Other | Admitting: Advanced Practice Midwife

## 2020-11-30 ENCOUNTER — Other Ambulatory Visit: Payer: Self-pay

## 2020-11-30 VITALS — BP 119/73 | HR 106 | Wt 139.0 lb

## 2020-11-30 DIAGNOSIS — R002 Palpitations: Secondary | ICD-10-CM

## 2020-11-30 DIAGNOSIS — R1011 Right upper quadrant pain: Secondary | ICD-10-CM

## 2020-11-30 DIAGNOSIS — Z3A22 22 weeks gestation of pregnancy: Secondary | ICD-10-CM

## 2020-11-30 DIAGNOSIS — Z3480 Encounter for supervision of other normal pregnancy, unspecified trimester: Secondary | ICD-10-CM

## 2020-11-30 NOTE — Progress Notes (Signed)
   PRENATAL VISIT NOTE  Subjective:  Bridget Walters is a 35 y.o. G3P2002 at [redacted]w[redacted]d being seen today for ongoing prenatal care.  She is currently monitored for the following issues for this high-risk pregnancy and has Pain, joint, multiple sites; Back pain; Migraine headache; Routine general medical examination at a health care facility; Underweight; Supervision of other normal pregnancy, antepartum; and AMA (advanced maternal age) multigravida 35+ on their problem list.  Patient reports no complaints.  Contractions: Not present. Vag. Bleeding: None.  Movement: Present. Denies leaking of fluid.   The following portions of the patient's history were reviewed and updated as appropriate: allergies, current medications, past family history, past medical history, past social history, past surgical history and problem list. Problem list updated.  Objective:   Vitals:   11/30/20 1134  BP: 119/73  Pulse: (!) 106  Weight: 139 lb (63 kg)    Fetal Status: Fetal Heart Rate (bpm): 151   Movement: Present     General:  Alert, oriented and cooperative. Patient is in no acute distress.  Skin: Skin is warm and dry. No rash noted.   Cardiovascular: Normal heart rate noted  Respiratory: Normal respiratory effort, no problems with respiration noted  Abdomen: Soft, gravid, appropriate for gestational age.  Pain/Pressure: Absent     Pelvic: Cervical exam deferred        Extremities: Normal range of motion.  Edema: None  Mental Status: Normal mood and affect. Normal behavior. Normal judgment and thought content.   Assessment and Plan:  Pregnancy: G3P2002 at [redacted]w[redacted]d  1. Supervision of other normal pregnancy, antepartum - No acute concerns - AFP, Serum, Open Spina Bifida  2. [redacted] weeks gestation of pregnancy   3. RUQ pain - GI Referral signed by Dr. Kennon Rounds 11/28/2020  4. Palpitations - S/p outpatient Cards consult, wearing Holter monitor to be followed by outpatient ECHO  Preterm labor symptoms  and general obstetric precautions including but not limited to vaginal bleeding, contractions, leaking of fluid and fetal movement were reviewed in detail with the patient. Please refer to After Visit Summary for other counseling recommendations.  Return in about 4 weeks (around 12/28/2020) for Kennon Rounds if possible, for 2 hour GTT.  Future Appointments  Date Time Provider [place]  12/06/2020  9:30 AM [facility] [place] 2 [facility]  12/26/2020 10:15 AM Virgel Manifold, MD [facility] None  12/29/2020  9:15 AM Donnamae Jude, MD [facility]  01/02/2021 10:40 AM Kate Sable, MD [facility]  01/03/2021  9:15 AM [facility] US2 [facility]  03/21/2021 10:30 AM Brendolyn Patty, MD [facility] None    Darlina Rumpf, CNM

## 2020-11-30 NOTE — Patient Instructions (Signed)

## 2020-12-02 LAB — AFP, SERUM, OPEN SPINA BIFIDA
AFP MoM: 0.75
AFP Value: 63.3 ng/mL
Gest. Age on Collection Date: 22.6 weeks
Maternal Age At EDD: 37.1 yr
OSBR Risk 1 IN: 10000
Test Results:: NEGATIVE
Weight: 139 [lb_av]

## 2020-12-06 ENCOUNTER — Other Ambulatory Visit: Payer: Self-pay

## 2020-12-06 ENCOUNTER — Ambulatory Visit (INDEPENDENT_AMBULATORY_CARE_PROVIDER_SITE_OTHER): Payer: Medicaid Other

## 2020-12-06 DIAGNOSIS — R0600 Dyspnea, unspecified: Secondary | ICD-10-CM | POA: Diagnosis not present

## 2020-12-06 DIAGNOSIS — R0609 Other forms of dyspnea: Secondary | ICD-10-CM

## 2020-12-06 LAB — ECHOCARDIOGRAM COMPLETE
Area-P 1/2: 4.06 cm2
S' Lateral: 2.9 cm

## 2020-12-24 ENCOUNTER — Other Ambulatory Visit: Payer: Self-pay | Admitting: Family Medicine

## 2020-12-24 DIAGNOSIS — K219 Gastro-esophageal reflux disease without esophagitis: Secondary | ICD-10-CM

## 2020-12-26 ENCOUNTER — Encounter: Payer: Self-pay | Admitting: Gastroenterology

## 2020-12-26 ENCOUNTER — Ambulatory Visit (INDEPENDENT_AMBULATORY_CARE_PROVIDER_SITE_OTHER): Payer: Medicaid Other | Admitting: Gastroenterology

## 2020-12-26 VITALS — BP 107/66 | HR 97 | Temp 98.1°F | Ht 67.0 in | Wt 146.6 lb

## 2020-12-26 DIAGNOSIS — K219 Gastro-esophageal reflux disease without esophagitis: Secondary | ICD-10-CM | POA: Diagnosis not present

## 2020-12-26 DIAGNOSIS — R1084 Generalized abdominal pain: Secondary | ICD-10-CM | POA: Diagnosis not present

## 2020-12-26 DIAGNOSIS — K5900 Constipation, unspecified: Secondary | ICD-10-CM | POA: Diagnosis not present

## 2020-12-26 MED ORDER — SUCRALFATE 1 GM/10ML PO SUSP: 1.0000 g | Freq: Two times a day (BID) | ORAL | 0 refills | Status: DC

## 2020-12-26 MED ORDER — PSYLLIUM 58.6 % PO POWD: 1.0000 | Freq: Every day | ORAL | 0 refills | Status: DC

## 2020-12-27 LAB — COMPREHENSIVE METABOLIC PANEL
ALT: 7 IU/L (ref 0–32)
AST: 15 IU/L (ref 0–40)
Albumin/Globulin Ratio: 1.7 (ref 1.2–2.2)
Albumin: 3.8 g/dL (ref 3.8–4.8)
Alkaline Phosphatase: 109 IU/L (ref 44–121)
BUN/Creatinine Ratio: 14 (ref 9–23)
BUN: 8 mg/dL (ref 6–20)
Bilirubin Total: <0.2 mg/dL (ref 0.0–1.2)
CO2: 19 mmol/L — ABNORMAL LOW (ref 20–29)
Calcium: 9 mg/dL (ref 8.7–10.2)
Chloride: 102 mmol/L (ref 96–106)
Creatinine, Ser: 0.58 mg/dL (ref 0.57–1.00)
Globulin, Total: 2.3 g/dL (ref 1.5–4.5)
Glucose: 75 mg/dL (ref 65–99)
Potassium: 3.9 mmol/L (ref 3.5–5.2)
Sodium: 136 mmol/L (ref 134–144)
Total Protein: 6.1 g/dL (ref 6.0–8.5)
eGFR: 120 mL/min/{1.73_m2} (ref >59)

## 2020-12-27 LAB — H PYLORI, IGM, IGG, IGA AB
H pylori, IgM Abs: <9 units (ref 0.0–8.9)
H. pylori, IgA Abs: <9 units (ref 0.0–8.9)
H. pylori, IgG AbS: 0.27 Index Value (ref 0.00–0.79)

## 2020-12-27 NOTE — Progress Notes (Signed)
Bridget Walters [address]  [place]  [place], [place] [address]  Main: [phone]  Fax: [phone]   Gastroenterology Consultation  Referring Provider:     Donnamae Jude, MD Primary Care Physician:  Tower, Wynelle Fanny, MD Reason for Consultation:    Abdominal pain        HPI:    Chief Complaint  Patient presents with  . New Patient (Initial Visit)    RUQ abdominal pain    Bridget Walters is a 35 y.o. y/o female referred for consultation & management  by Dr. Glori Walters, Wynelle Fanny, MD.  Patient reports abdominal pain and bloating and heartburn during her current pregnancy that was not present in prior pregnancies.  Pain described as dull, nonradiating, right upper quadrant to midepigastric region.  Reports 1 bowel movement that is soft every other day.  No straining.  No blood in stool.  Was prescribed PPI but only taking it 2 or 3 times a week, and symptoms continue despite this.  Was taking antacids as well before we continue to have symptoms despite that.  Reports having abdominal bloating after meals.  No nausea or vomiting.  No weight loss.  Symptoms started 2 to 3 months ago.  No dysphagia.  Past Medical History:  Diagnosis Date  . Atypical mole 02/20/2016   R lower back sup/mild-mod, R lower back inf/mild-mod  . Atypical mole 03/17/2018   R lateral ankle/mild, R buttock/mild, L lateral hip/mild  . history of blood in stool   . History of chicken pox   . History of fainting   . History of hay fever   . History of headache   . History of migraine   . History of urinary incontinence   . History of UTI     Past Surgical History:  Procedure Laterality Date  . NO PAST SURGERIES      Prior to Admission medications   Medication Sig Start Date End Date Taking? Authorizing Provider  doxylamine, Sleep, (UNISOM) 25 MG tablet Take 25 mg by mouth at bedtime as needed.   Yes [provider]  Prenatal Vit-Fe Fumarate-FA (MULTIVITAMIN-PRENATAL) 27-0.8 MG TABS  tablet Take 1 tablet by mouth daily at 12 noon.   Yes [provider]  psyllium (METAMUCIL) 58.6 % powder Take 1 packet by mouth daily. 12/26/20 03/26/21 Yes Bridget Manifold, MD  sucralfate (CARAFATE) 1 GM/10ML suspension Take 10 mLs (1 g total) by mouth 2 (two) times daily. 12/26/20 01/25/21 Yes Bridget Manifold, MD  cyclobenzaprine (FLEXERIL) 10 MG tablet Take 1 tablet (10 mg total) by mouth 3 (three) times daily as needed for muscle spasms. For muscle pain , use with caution of sedation Patient not taking: Reported on 12/26/2020 11/12/18   Bridget Greenspan, MD    Family History  Problem Relation Age of Onset  . Diabetes Father   . Cancer Father        skin cancer  . Hypertension Father   . Kidney disease Father        had Kidney transplant  . Diabetes Maternal Grandmother   . Arthritis Maternal Grandmother   . Diabetes Maternal Grandfather   . Hyperlipidemia Paternal Grandmother   . Cancer Paternal Grandmother   . Lung cancer Paternal Grandmother   . Prostate cancer Paternal Grandfather   . Diabetes Paternal Grandfather   . Anesthesia problems Neg Hx   . Other Neg Hx      Social History   Tobacco Use  .  Smoking status: Never Smoker  . Smokeless tobacco: Never Used  Vaping Use  . Vaping Use: Never used  Substance Use Topics  . Alcohol use: No  . Drug use: No    Allergies as of 12/26/2020 - Review Complete 12/26/2020  Allergen Reaction Noted  . Minocycline hcl Hives   . Sumatriptan Nausea Only     Review of Systems:    All systems reviewed and negative except where noted in HPI.   Physical Exam:  BP 107/66   Pulse 97   Temp 98.1 F (36.7 C) (Temporal)   Ht 5\' 7"  (1.702 m)   Wt 146 lb 9.6 oz (66.5 kg)   LMP 06/23/2020   BMI 22.96 kg/m  Patient's last menstrual period was 06/23/2020. Psych:  Alert and cooperative. Normal mood and affect. General:   Alert,  Well-developed, well-nourished, pleasant and cooperative in NAD Head:  Normocephalic and  atraumatic. Eyes:  Sclera clear, no icterus.   Conjunctiva pink. Ears:  Normal auditory acuity. Nose:  No deformity, discharge, or lesions. Mouth:  No deformity or lesions,oropharynx pink & moist. Neck:  Supple; no masses or thyromegaly. Abdomen:  Normal bowel sounds.  No bruits.  Soft, non-tender and non-distended without masses, hepatosplenomegaly or hernias noted.  No guarding or rebound tenderness.    Msk:  Symmetrical without gross deformities. Good, equal movement & strength bilaterally. Pulses:  Normal pulses noted. Extremities:  No clubbing or edema.  No cyanosis. Neurologic:  Alert and oriented x3;  grossly normal neurologically. Skin:  Intact without significant lesions or rashes. No jaundice. Lymph Nodes:  No significant cervical adenopathy. Psych:  Alert and cooperative. Normal mood and affect.   Labs: CBC    Component Value Date/Time   WBC 8.8 09/06/2020 1026   WBC 9.5 08/15/2020 1419   RBC 4.46 09/06/2020 1026   RBC 4.55 08/15/2020 1419   HGB 13.3 09/06/2020 1026   HCT 39.3 09/06/2020 1026   PLT 352 09/06/2020 1026   MCV 88 09/06/2020 1026   MCH 29.8 09/06/2020 1026   MCH 30.5 08/15/2020 1419   MCHC 33.8 09/06/2020 1026   MCHC 35.1 08/15/2020 1419   RDW 12.5 09/06/2020 1026   LYMPHSABS 1.5 09/06/2020 1026   MONOABS 0.5 09/02/2018 1143   EOSABS 0.0 09/06/2020 1026   BASOSABS 0.0 09/06/2020 1026   CMP     Component Value Date/Time   NA 136 12/26/2020 1119   K 3.9 12/26/2020 1119   CL 102 12/26/2020 1119   CO2 19 (L) 12/26/2020 1119   GLUCOSE 75 12/26/2020 1119   GLUCOSE 82 08/15/2020 1419   BUN 8 12/26/2020 1119   CREATININE 0.58 12/26/2020 1119   CALCIUM 9.0 12/26/2020 1119   PROT 6.1 12/26/2020 1119   ALBUMIN 3.8 12/26/2020 1119   AST 15 12/26/2020 1119   ALT 7 12/26/2020 1119   ALKPHOS 109 12/26/2020 1119   BILITOT <0.2 12/26/2020 1119   GFRNONAA >60 08/15/2020 1419   GFRAA 124 12/09/2018 1023    Imaging Studies: ECHOCARDIOGRAM  COMPLETE  Result Date: 12/06/2020    ECHOCARDIOGRAM REPORT   Patient Name:   Bridget Walters Date of Exam: 12/06/2020 Medical Rec #:  [id number]           Height:       67.0 in Accession #:    [id number]          Weight:       139.0 lb Date of Birth:  1986-08-01  BSA:          1.733 m Patient Age:    34 years            BP:           110/66 mmHg Patient Gender: F                   HR:           100 bpm. Exam Location:  [facility] Procedure: 2D Echo, Cardiac Doppler and Color Doppler Indications:    R06.9 DOE; R00.0 Tachycardia  History:        Patient has no prior history of Echocardiogram examinations. [redacted]                 weeks pregnant, Arrythmias:Tachycardia; Signs/Symptoms:Dyspnea.  Sonographer:    Geradine Girt Referring Phys: [id number] BRIAN AGBOR-ETANG IMPRESSIONS  1. Left ventricular ejection fraction, by estimation, is 55 to 60%. The left ventricle has normal function. The left ventricle has no regional wall motion abnormalities. Left ventricular diastolic parameters were normal.  2. Right ventricular systolic function is normal. The right ventricular size is normal. There is normal pulmonary artery systolic pressure.  3. The mitral valve is normal in structure. Trivial mitral valve regurgitation. No evidence of mitral stenosis.  4. The aortic valve is tricuspid. Aortic valve regurgitation is not visualized. No aortic stenosis is present.  5. The inferior vena cava is normal in size with greater than 50% respiratory variability, suggesting right atrial pressure of 3 mmHg. FINDINGS  Left Ventricle: Left ventricular ejection fraction, by estimation, is 55 to 60%. The left ventricle has normal function. The left ventricle has no regional wall motion abnormalities. The left ventricular internal cavity size was normal in size. There is  no left ventricular hypertrophy. Left ventricular diastolic parameters were normal. Right Ventricle: The right ventricular size is normal. No increase in right ventricular  wall thickness. Right ventricular systolic function is normal. There is normal pulmonary artery systolic pressure. The tricuspid regurgitant velocity is 2.25 m/s, and  with an assumed right atrial pressure of 3 mmHg, the estimated right ventricular systolic pressure is 14.7 mmHg. Left Atrium: Left atrial size was normal in size. Right Atrium: Right atrial size was normal in size. Pericardium: There is no evidence of pericardial effusion. Mitral Valve: The mitral valve is normal in structure. Trivial mitral valve regurgitation. No evidence of mitral valve stenosis. Tricuspid Valve: The tricuspid valve is normal in structure. Tricuspid valve regurgitation is mild. Aortic Valve: The aortic valve is tricuspid. Aortic valve regurgitation is not visualized. No aortic stenosis is present. Pulmonic Valve: The pulmonic valve was grossly normal. Pulmonic valve regurgitation is not visualized. No evidence of pulmonic stenosis. Aorta: The aortic root and ascending aorta are structurally normal, with no evidence of dilitation. Venous: The inferior vena cava is normal in size with greater than 50% respiratory variability, suggesting right atrial pressure of 3 mmHg. IAS/Shunts: The interatrial septum was not well visualized.  LEFT VENTRICLE PLAX 2D LVIDd:         4.60 cm  Diastology LVIDs:         2.90 cm  LV e' medial:    9.36 cm/s LV PW:         0.80 cm  LV E/e' medial:  10.2 LV IVS:        0.50 cm  LV e' lateral:   12.80 cm/s LVOT diam:     1.90 cm  LV E/e' lateral: 7.4 LV SV:  66 LV SV Index:   38 LVOT Area:     2.84 cm                          3D Volume EF:                         3D EF:        58 %                         LV EDV:       139 ml                         LV ESV:       59 ml                         LV SV:        80 ml RIGHT VENTRICLE RV S prime:     16.20 cm/s TAPSE (M-mode): 3.0 cm LEFT ATRIUM             Index       RIGHT ATRIUM           Index LA diam:        3.10 cm 1.79 cm/m  RA Area:     12.30 cm  LA Vol (A2C):   38.1 ml 21.99 ml/m RA Volume:   25.50 ml  14.72 ml/m LA Vol (A4C):   32.1 ml 18.53 ml/m LA Biplane Vol: 34.6 ml 19.97 ml/m  AORTIC VALVE LVOT Vmax:   132.00 cm/s LVOT Vmean:  88.700 cm/s LVOT VTI:    0.234 m  AORTA Ao Root diam: 2.50 cm Ao Asc diam:  2.50 cm MITRAL VALVE               TRICUSPID VALVE MV Area (PHT): 4.06 cm    TR Peak grad:   20.2 mmHg MV Decel Time: 187 msec    TR Vmax:        225.00 cm/s MV E velocity: 95.10 cm/s MV A velocity: 95.50 cm/s  SHUNTS MV E/A ratio:  1.00        Systemic VTI:  0.23 m                            Systemic Diam: 1.90 cm Nelva Bush MD Electronically signed by Nelva Bush MD Signature Date/Time: 12/06/2020/6:41:20 PM    Final    LONG TERM MONITOR (3-14 DAYS)  Result Date: 12/15/2020 Patch Wear Time:  13 days and 23 hours (2022-01-24T10:35:01-499 to 2022-02-07T10:20:47-0500) Patient had a min HR of 63 bpm, max HR of 181 bpm, and avg HR of 95 bpm. Predominant underlying rhythm was Sinus Rhythm. 1 run of Supraventricular Tachycardia occurred lasting 4 beats not associated with patient triggered events. Patient triggered events associated with sinus rhythm / sinus tachycardia. Overall benign cardiac monitor  US ABDOMEN LIMITED RUQ (LIVER/GB)  Result Date: 11/28/2020 CLINICAL DATA:  Colicky RIGHT upper quadrant abdominal pain for 2 months EXAM: ULTRASOUND ABDOMEN LIMITED RIGHT UPPER QUADRANT COMPARISON:  None FINDINGS: Gallbladder: Normally distended without stones or wall thickening. No pericholecystic fluid or sonographic Murphy sign. Common bile duct: Diameter: 6 mm, upper normal caliber Liver: Normal echogenicity without mass or nodularity. No intrahepatic biliary dilatation. Portal vein is patent  on color Doppler imaging with normal direction of blood flow towards the liver. Other: No RIGHT upper quadrant free fluid. IMPRESSION: Normal exam. Electronically Signed   By: Lavonia Dana M.D.   On: 11/28/2020 11:30    Assessment and Plan:    Bridget Walters is a 35 y.o. y/o female has been referred for abdominal pain and patient currently pregnant  Right upper quadrant ultrasound reviewed and reassuring I have obtained liver enzymes today and these are normal  No alarm symptoms present Her symptoms may be related to having some underlying constipation since she is only having a bowel mood every other day.  Start Metamucil with goal of 1-2 soft bowel movements a day and titration instructions given  High-fiber diet  Metamucil daily with goal of 1-2 soft bowel movements daily.  If not at goal, patient instructed to increase dose to twice daily.  If loose stools with the medication, patient asked to decrease the medication to every other day, or half dose daily.  Patient verbalized understanding  Discontinue PPI as patient only taking it occasionally and is not helping symptoms.  Start sucralfate instead as that is what is recommended in pregnancy after starting antacids which patient has already tried  Reassess symptoms on the above  Obtain H. pylori serology as well   Dr Bridget Walters  Speech recognition software was used to dictate the above note.

## 2020-12-29 ENCOUNTER — Other Ambulatory Visit: Payer: Self-pay

## 2020-12-29 ENCOUNTER — Ambulatory Visit (INDEPENDENT_AMBULATORY_CARE_PROVIDER_SITE_OTHER): Payer: Medicaid Other | Admitting: Family Medicine

## 2020-12-29 VITALS — BP 102/68 | HR 89 | Wt 144.4 lb

## 2020-12-29 DIAGNOSIS — Z3480 Encounter for supervision of other normal pregnancy, unspecified trimester: Secondary | ICD-10-CM

## 2020-12-29 DIAGNOSIS — O09522 Supervision of elderly multigravida, second trimester: Secondary | ICD-10-CM

## 2020-12-29 NOTE — Progress Notes (Signed)
   PRENATAL VISIT NOTE  Subjective:  Bridget Walters is a 35 y.o. G3P2002 at [redacted]w[redacted]d being seen today for ongoing prenatal care.  She is currently monitored for the following issues for this low-risk pregnancy and has Pain, joint, multiple sites; Back pain; Migraine headache; Routine general medical examination at a health care facility; Underweight; Supervision of other normal pregnancy, antepartum; and AMA (advanced maternal age) multigravida 35+ on their problem list.  Patient reports no complaints.  Contractions: Not present. Vag. Bleeding: None.  Movement: Present. Denies leaking of fluid.   The following portions of the patient's history were reviewed and updated as appropriate: allergies, current medications, past family history, past medical history, past social history, past surgical history and problem list.   Objective:   Vitals:   12/29/20 0925  BP: 102/68  Pulse: 89  Weight: 144 lb 6.4 oz (65.5 kg)    Fetal Status: Fetal Heart Rate (bpm): 147 Fundal Height: 27 cm Movement: Present     General:  Alert, oriented and cooperative. Patient is in no acute distress.  Skin: Skin is warm and dry. No rash noted.   Cardiovascular: Normal heart rate noted  Respiratory: Normal respiratory effort, no problems with respiration noted  Abdomen: Soft, gravid, appropriate for gestational age.  Pain/Pressure: Present     Pelvic: Cervical exam deferred        Extremities: Normal range of motion.  Edema: None  Mental Status: Normal mood and affect. Normal behavior. Normal judgment and thought content.   Assessment and Plan:  Pregnancy: G3P2002 at [redacted]w[redacted]d 1. Supervision of other normal pregnancy, antepartum 28 wk labs today Declined TDaP - Glucose Tolerance, 2 Hours w/1 Hour - CBC - RPR - HIV Antibody (routine testing w rflx)  2. Multigravida of advanced maternal age in second trimester Normal NIPT  Preterm labor symptoms and general obstetric precautions including but not limited to  vaginal bleeding, contractions, leaking of fluid and fetal movement were reviewed in detail with the patient. Please refer to After Visit Summary for other counseling recommendations.   Return in 2 weeks (on 01/12/2021).  Future Appointments  Date Time Provider [place]  01/03/2021  9:00 AM [facility] NURSE [facility]  01/03/2021  9:15 AM [facility]  02/09/2021  1:15 PM Virgel Manifold, MD [facility] None  03/21/2021 10:30 AM Brendolyn Patty, MD [facility] None    Donnamae Jude, MD

## 2020-12-29 NOTE — Patient Instructions (Signed)

## 2020-12-30 LAB — GLUCOSE TOLERANCE, 2 HOURS W/ 1HR
Glucose, 1 hour: 151 mg/dL (ref 65–179)
Glucose, 2 hour: 139 mg/dL (ref 65–152)
Glucose, Fasting: 83 mg/dL (ref 65–91)

## 2020-12-30 LAB — CBC
Hematocrit: 31.8 % — ABNORMAL LOW (ref 34.0–46.6)
Hemoglobin: 10.9 g/dL — ABNORMAL LOW (ref 11.1–15.9)
MCH: 30.3 pg (ref 26.6–33.0)
MCHC: 34.3 g/dL (ref 31.5–35.7)
MCV: 88 fL (ref 79–97)
Platelets: 313 10*3/uL (ref 150–450)
RBC: 3.6 x10E6/uL — ABNORMAL LOW (ref 3.77–5.28)
RDW: 12.6 % (ref 11.7–15.4)
WBC: 8.3 10*3/uL (ref 3.4–10.8)

## 2020-12-30 LAB — HIV ANTIBODY (ROUTINE TESTING W REFLEX): HIV Screen 4th Generation wRfx: NONREACTIVE

## 2020-12-30 LAB — RPR: RPR Ser Ql: NONREACTIVE

## 2021-01-02 ENCOUNTER — Ambulatory Visit: Payer: Medicaid Other | Admitting: Cardiology

## 2021-01-03 ENCOUNTER — Other Ambulatory Visit: Payer: Self-pay

## 2021-01-03 ENCOUNTER — Ambulatory Visit: Payer: Medicaid Other | Admitting: *Deleted

## 2021-01-03 ENCOUNTER — Ambulatory Visit: Payer: Medicaid Other | Attending: Obstetrics and Gynecology

## 2021-01-03 ENCOUNTER — Encounter: Payer: Self-pay | Admitting: *Deleted

## 2021-01-03 DIAGNOSIS — Z3480 Encounter for supervision of other normal pregnancy, unspecified trimester: Secondary | ICD-10-CM | POA: Insufficient documentation

## 2021-01-03 DIAGNOSIS — O289 Unspecified abnormal findings on antenatal screening of mother: Secondary | ICD-10-CM | POA: Diagnosis not present

## 2021-01-03 DIAGNOSIS — Z3A27 27 weeks gestation of pregnancy: Secondary | ICD-10-CM | POA: Diagnosis not present

## 2021-01-03 DIAGNOSIS — Z363 Encounter for antenatal screening for malformations: Secondary | ICD-10-CM | POA: Diagnosis not present

## 2021-01-03 DIAGNOSIS — O09522 Supervision of elderly multigravida, second trimester: Secondary | ICD-10-CM | POA: Diagnosis not present

## 2021-01-18 ENCOUNTER — Encounter: Payer: Self-pay | Admitting: Family Medicine

## 2021-01-18 ENCOUNTER — Ambulatory Visit (INDEPENDENT_AMBULATORY_CARE_PROVIDER_SITE_OTHER): Payer: Medicaid Other | Admitting: Family Medicine

## 2021-01-18 ENCOUNTER — Other Ambulatory Visit: Payer: Self-pay

## 2021-01-18 VITALS — BP 117/71 | HR 91 | Wt 146.4 lb

## 2021-01-18 DIAGNOSIS — Z23 Encounter for immunization: Secondary | ICD-10-CM | POA: Diagnosis not present

## 2021-01-18 DIAGNOSIS — Z3480 Encounter for supervision of other normal pregnancy, unspecified trimester: Secondary | ICD-10-CM | POA: Diagnosis not present

## 2021-01-18 DIAGNOSIS — Z3A29 29 weeks gestation of pregnancy: Secondary | ICD-10-CM

## 2021-01-18 DIAGNOSIS — O09523 Supervision of elderly multigravida, third trimester: Secondary | ICD-10-CM

## 2021-01-18 NOTE — Addendum Note (Signed)
Addended by: Huey Bienenstock on: 01/18/2021 10:36 AM   Modules accepted: Orders

## 2021-01-18 NOTE — Progress Notes (Signed)
   PRENATAL VISIT NOTE  Subjective:  Bridget Walters is a 35 y.o. G3P2002 at [redacted]w[redacted]d being seen today for ongoing prenatal care.  She is currently monitored for the following issues for this high-risk pregnancy and has Pain, joint, multiple sites; Back pain; Migraine headache; Routine general medical examination at a health care facility; Underweight; Supervision of other normal pregnancy, antepartum; and AMA (advanced maternal age) multigravida 35+ on their problem list.  Patient reports no complaints.  Contractions: Not present. Vag. Bleeding: None.  Movement: Present. Denies leaking of fluid.   The following portions of the patient's history were reviewed and updated as appropriate: allergies, current medications, past family history, past medical history, past social history, past surgical history and problem list.   Objective:   Vitals:   01/18/21 1007  BP: 117/71  Pulse: 91  Weight: 146 lb 6.4 oz (66.4 kg)    Fetal Status: Fetal Heart Rate (bpm): 138   Movement: Present     General:  Alert, oriented and cooperative. Patient is in no acute distress.  Skin: Skin is warm and dry. No rash noted.   Cardiovascular: Normal heart rate noted  Respiratory: Normal respiratory effort, no problems with respiration noted  Abdomen: Soft, gravid, appropriate for gestational age.  Pain/Pressure: Absent     Pelvic: Cervical exam deferred        Extremities: Normal range of motion.  Edema: None  Mental Status: Normal mood and affect. Normal behavior. Normal judgment and thought content.   Assessment and Plan:  Pregnancy: G3P2002 at [redacted]w[redacted]d 1. Supervision of other normal pregnancy, antepartum Tdap today Discussion about contraception-- desires POP but will also used lactational amenorrhea (last 2 pregnancies did not get menses for over 1 year). Discussed this is valid method for the 6 months after delivery if exclusively breastfeeding.   2. Multigravida of advanced maternal age in third  trimester NIPT low risk  3. [redacted] weeks gestation of pregnancy  Preterm labor symptoms and general obstetric precautions including but not limited to vaginal bleeding, contractions, leaking of fluid and fetal movement were reviewed in detail with the patient. Please refer to After Visit Summary for other counseling recommendations.   Return in about 2 weeks (around 02/01/2021) for Routine prenatal care.  Future Appointments  Date Time Provider [place]  02/02/2021 11:00 AM Donnamae Jude, MD [facility]  02/09/2021  1:15 PM Virgel Manifold, MD [facility] None  02/14/2021  9:15 AM Donnamae Jude, MD [facility]  03/21/2021 10:30 AM Brendolyn Patty, MD [facility] None    Caren Macadam, MD

## 2021-02-02 ENCOUNTER — Ambulatory Visit (INDEPENDENT_AMBULATORY_CARE_PROVIDER_SITE_OTHER): Payer: Medicaid Other | Admitting: Family Medicine

## 2021-02-02 ENCOUNTER — Other Ambulatory Visit: Payer: Self-pay

## 2021-02-02 VITALS — BP 130/75 | HR 99 | Wt 150.0 lb

## 2021-02-02 DIAGNOSIS — O09523 Supervision of elderly multigravida, third trimester: Secondary | ICD-10-CM

## 2021-02-02 DIAGNOSIS — Z3480 Encounter for supervision of other normal pregnancy, unspecified trimester: Secondary | ICD-10-CM

## 2021-02-02 NOTE — Patient Instructions (Signed)

## 2021-02-02 NOTE — Progress Notes (Signed)
   PRENATAL VISIT NOTE  Subjective:  Bridget Walters is a 35 y.o. G3P2002 at [redacted]w[redacted]d being seen today for ongoing prenatal care.  She is currently monitored for the following issues for this low-risk pregnancy and has Pain, joint, multiple sites; Back pain; Migraine headache; Routine general medical examination at a health care facility; Underweight; Supervision of other normal pregnancy, antepartum; and AMA (advanced maternal age) multigravida 35+ on their problem list.  Patient reports no complaints.  Contractions: Not present. Vag. Bleeding: None.  Movement: Present. Denies leaking of fluid.   The following portions of the patient's history were reviewed and updated as appropriate: allergies, current medications, past family history, past medical history, past social history, past surgical history and problem list.   Objective:   Vitals:   02/02/21 1105  BP: 130/75  Pulse: 99  Weight: 150 lb (68 kg)    Fetal Status: Fetal Heart Rate (bpm): 136 Fundal Height: 31 cm Movement: Present     General:  Alert, oriented and cooperative. Patient is in no acute distress.  Skin: Skin is warm and dry. No rash noted.   Cardiovascular: Normal heart rate noted  Respiratory: Normal respiratory effort, no problems with respiration noted  Abdomen: Soft, gravid, appropriate for gestational age.  Pain/Pressure: Present     Pelvic: Cervical exam deferred        Extremities: Normal range of motion.  Edema: Trace  Mental Status: Normal mood and affect. Normal behavior. Normal judgment and thought content.   Assessment and Plan:  Pregnancy: G3P2002 at [redacted]w[redacted]d 1. Supervision of other normal pregnancy, antepartum Continue routine prenatal care. Travel precautions reviewed.  2. Multigravida of advanced maternal age in third trimester Normal NIPT  Preterm labor symptoms and general obstetric precautions including but not limited to vaginal bleeding, contractions, leaking of fluid and fetal movement were  reviewed in detail with the patient. Please refer to After Visit Summary for other counseling recommendations.   Return in 2 weeks (on 02/16/2021).  Future Appointments  Date Time Provider [place]  02/14/2021  9:15 AM Donnamae Jude, MD [facility]  03/09/2021  3:00 PM Donnamae Jude, MD [facility]  03/21/2021 10:30 AM Brendolyn Patty, MD [facility] None    Donnamae Jude, MD

## 2021-02-09 ENCOUNTER — Ambulatory Visit: Payer: Medicaid Other | Admitting: Gastroenterology

## 2021-02-14 ENCOUNTER — Telehealth (INDEPENDENT_AMBULATORY_CARE_PROVIDER_SITE_OTHER): Payer: Medicaid Other | Admitting: Family Medicine

## 2021-02-14 ENCOUNTER — Other Ambulatory Visit: Payer: Self-pay

## 2021-02-14 VITALS — BP 123/69 | Wt 151.0 lb

## 2021-02-14 DIAGNOSIS — J302 Other seasonal allergic rhinitis: Secondary | ICD-10-CM

## 2021-02-14 DIAGNOSIS — M549 Dorsalgia, unspecified: Secondary | ICD-10-CM

## 2021-02-14 DIAGNOSIS — O99513 Diseases of the respiratory system complicating pregnancy, third trimester: Secondary | ICD-10-CM

## 2021-02-14 DIAGNOSIS — Z3480 Encounter for supervision of other normal pregnancy, unspecified trimester: Secondary | ICD-10-CM

## 2021-02-14 DIAGNOSIS — Z3A33 33 weeks gestation of pregnancy: Secondary | ICD-10-CM

## 2021-02-14 DIAGNOSIS — O09523 Supervision of elderly multigravida, third trimester: Secondary | ICD-10-CM

## 2021-02-14 DIAGNOSIS — O99891 Other specified diseases and conditions complicating pregnancy: Secondary | ICD-10-CM

## 2021-02-14 DIAGNOSIS — G43909 Migraine, unspecified, not intractable, without status migrainosus: Secondary | ICD-10-CM

## 2021-02-14 DIAGNOSIS — O99353 Diseases of the nervous system complicating pregnancy, third trimester: Secondary | ICD-10-CM

## 2021-02-14 DIAGNOSIS — R600 Localized edema: Secondary | ICD-10-CM

## 2021-02-14 NOTE — Patient Instructions (Addendum)
You can take Mucinex, (Allegra, Claritin, Zyrtec-any one of these--they are the same class--same as Benadryl), sudafed, saline and steroid nasal sprays   Breastfeeding  Choosing to breastfeed is one of the best decisions you can make for yourself and your baby. A change in hormones during pregnancy causes your breasts to make breast milk in your milk-producing glands. Hormones prevent breast milk from being released before your baby is born. They also prompt milk flow after birth. Once breastfeeding has begun, thoughts of your baby, as well as his or her sucking or crying, can stimulate the release of milk from your milk-producing glands. Benefits of breastfeeding Research shows that breastfeeding offers many health benefits for infants and mothers. It also offers a cost-free and convenient way to feed your baby. For your baby  Your first milk (colostrum) helps your baby's digestive system to function better.  Special cells in your milk (antibodies) help your baby to fight off infections.  Breastfed babies are less likely to develop asthma, allergies, obesity, or type 2 diabetes. They are also at lower risk for sudden infant death syndrome (SIDS).  Nutrients in breast milk are better able to meet your baby's needs compared to infant formula.  Breast milk improves your baby's brain development. For you  Breastfeeding helps to create a very special bond between you and your baby.  Breastfeeding is convenient. Breast milk costs nothing and is always available at the correct temperature.  Breastfeeding helps to burn calories. It helps you to lose the weight that you gained during pregnancy.  Breastfeeding makes your uterus return faster to its size before pregnancy. It also slows bleeding (lochia) after you give birth.  Breastfeeding helps to lower your risk of developing type 2 diabetes, osteoporosis, rheumatoid arthritis, cardiovascular disease, and breast, ovarian, uterine, and  endometrial cancer later in life. Breastfeeding basics Starting breastfeeding  Find a comfortable place to sit or lie down, with your neck and back well-supported.  Place a pillow or a rolled-up blanket under your baby to bring him or her to the level of your breast (if you are seated). Nursing pillows are specially designed to help support your arms and your baby while you breastfeed.  Make sure that your baby's tummy (abdomen) is facing your abdomen.  Gently massage your breast. With your fingertips, massage from the outer edges of your breast inward toward the nipple. This encourages milk flow. If your milk flows slowly, you may need to continue this action during the feeding.  Support your breast with 4 fingers underneath and your thumb above your nipple (make the letter "C" with your hand). Make sure your fingers are well away from your nipple and your baby's mouth.  Stroke your baby's lips gently with your finger or nipple.  When your baby's mouth is open wide enough, quickly bring your baby to your breast, placing your entire nipple and as much of the areola as possible into your baby's mouth. The areola is the colored area around your nipple. ? More areola should be visible above your baby's upper lip than below the lower lip. ? Your baby's lips should be opened and extended outward (flanged) to ensure an adequate, comfortable latch. ? Your baby's tongue should be between his or her lower gum and your breast.  Make sure that your baby's mouth is correctly positioned around your nipple (latched). Your baby's lips should create a seal on your breast and be turned out (everted).  It is common for your baby to suck  about 2-3 minutes in order to start the flow of breast milk. Latching Teaching your baby how to latch onto your breast properly is very important. An improper latch can cause nipple pain, decreased milk supply, and poor weight gain in your baby. Also, if your baby is not  latched onto your nipple properly, he or she may swallow some air during feeding. This can make your baby fussy. Burping your baby when you switch breasts during the feeding can help to get rid of the air. However, teaching your baby to latch on properly is still the best way to prevent fussiness from swallowing air while breastfeeding. Signs that your baby has successfully latched onto your nipple  Silent tugging or silent sucking, without causing you pain. Infant's lips should be extended outward (flanged).  Swallowing heard between every 3-4 sucks once your milk has started to flow (after your let-down milk reflex occurs).  Muscle movement above and in front of his or her ears while sucking. Signs that your baby has not successfully latched onto your nipple  Sucking sounds or smacking sounds from your baby while breastfeeding.  Nipple pain. If you think your baby has not latched on correctly, slip your finger into the corner of your baby's mouth to break the suction and place it between your baby's gums. Attempt to start breastfeeding again. Signs of successful breastfeeding Signs from your baby  Your baby will gradually decrease the number of sucks or will completely stop sucking.  Your baby will fall asleep.  Your baby's body will relax.  Your baby will retain a small amount of milk in his or her mouth.  Your baby will let go of your breast by himself or herself. Signs from you  Breasts that have increased in firmness, weight, and size 1-3 hours after feeding.  Breasts that are softer immediately after breastfeeding.  Increased milk volume, as well as a change in milk consistency and color by the fifth day of breastfeeding.  Nipples that are not sore, cracked, or bleeding. Signs that your baby is getting enough milk  Wetting at least 1-2 diapers during the first 24 hours after birth.  Wetting at least 5-6 diapers every 24 hours for the first week after birth. The urine  should be clear or pale yellow by the age of 5 days.  Wetting 6-8 diapers every 24 hours as your baby continues to grow and develop.  At least 3 stools in a 24-hour period by the age of 5 days. The stool should be soft and yellow.  At least 3 stools in a 24-hour period by the age of 7 days. The stool should be seedy and yellow.  No loss of weight greater than 10% of birth weight during the first 3 days of life.  Average weight gain of 4-7 oz (113-198 g) per week after the age of 4 days.  Consistent daily weight gain by the age of 5 days, without weight loss after the age of 2 weeks. After a feeding, your baby may spit up a small amount of milk. This is normal. Breastfeeding frequency and duration Frequent feeding will help you make more milk and can prevent sore nipples and extremely full breasts (breast engorgement). Breastfeed when you feel the need to reduce the fullness of your breasts or when your baby shows signs of hunger. This is called "breastfeeding on demand." Signs that your baby is hungry include:  Increased alertness, activity, or restlessness.  Movement of the head from side to  side.  Opening of the mouth when the corner of the mouth or cheek is stroked (rooting).  Increased sucking sounds, smacking lips, cooing, sighing, or squeaking.  Hand-to-mouth movements and sucking on fingers or hands.  Fussing or crying. Avoid introducing a pacifier to your baby in the first 4-6 weeks after your baby is born. After this time, you may choose to use a pacifier. Research has shown that pacifier use during the first year of a baby's life decreases the risk of sudden infant death syndrome (SIDS). Allow your baby to feed on each breast as long as he or she wants. When your baby unlatches or falls asleep while feeding from the first breast, offer the second breast. Because newborns are often sleepy in the first few weeks of life, you may need to awaken your baby to get him or her to  feed. Breastfeeding times will vary from baby to baby. However, the following rules can serve as a guide to help you make sure that your baby is properly fed:  Newborns (babies 86 weeks of age or younger) may breastfeed every 1-3 hours.  Newborns should not go without breastfeeding for longer than 3 hours during the day or 5 hours during the night.  You should breastfeed your baby a minimum of 8 times in a 24-hour period. Breast milk pumping Pumping and storing breast milk allows you to make sure that your baby is exclusively fed your breast milk, even at times when you are unable to breastfeed. This is especially important if you go back to work while you are still breastfeeding, or if you are not able to be present during feedings. Your lactation consultant can help you find a method of pumping that works best for you and give you guidelines about how long it is safe to store breast milk.      Caring for your breasts while you breastfeed Nipples can become dry, cracked, and sore while breastfeeding. The following recommendations can help keep your breasts moisturized and healthy:  Avoid using soap on your nipples.  Wear a supportive bra designed especially for nursing. Avoid wearing underwire-style bras or extremely tight bras (sports bras).  Air-dry your nipples for 3-4 minutes after each feeding.  Use only cotton bra pads to absorb leaked breast milk. Leaking of breast milk between feedings is normal.  Use lanolin on your nipples after breastfeeding. Lanolin helps to maintain your skin's normal moisture barrier. Pure lanolin is not harmful (not toxic) to your baby. You may also hand express a few drops of breast milk and gently massage that milk into your nipples and allow the milk to air-dry. In the first few weeks after giving birth, some women experience breast engorgement. Engorgement can make your breasts feel heavy, warm, and tender to the touch. Engorgement peaks within 3-5 days  after you give birth. The following recommendations can help to ease engorgement:  Completely empty your breasts while breastfeeding or pumping. You may want to start by applying warm, moist heat (in the shower or with warm, water-soaked hand towels) just before feeding or pumping. This increases circulation and helps the milk flow. If your baby does not completely empty your breasts while breastfeeding, pump any extra milk after he or she is finished.  Apply ice packs to your breasts immediately after breastfeeding or pumping, unless this is too uncomfortable for you. To do this: ? Put ice in a plastic bag. ? Place a towel between your skin and the bag. ? Leave  the ice on for 20 minutes, 2-3 times a day.  Make sure that your baby is latched on and positioned properly while breastfeeding. If engorgement persists after 48 hours of following these recommendations, contact your health care provider or a Science writer. Overall health care recommendations while breastfeeding  Eat 3 healthy meals and 3 snacks every day. Well-nourished mothers who are breastfeeding need an additional 450-500 calories a day. You can meet this requirement by increasing the amount of a balanced diet that you eat.  Drink enough water to keep your urine pale yellow or clear.  Rest often, relax, and continue to take your prenatal vitamins to prevent fatigue, stress, and low vitamin and mineral levels in your body (nutrient deficiencies).  Do not use any products that contain nicotine or tobacco, such as cigarettes and e-cigarettes. Your baby may be harmed by chemicals from cigarettes that pass into breast milk and exposure to secondhand smoke. If you need help quitting, ask your health care provider.  Avoid alcohol.  Do not use illegal drugs or marijuana.  Talk with your health care provider before taking any medicines. These include over-the-counter and prescription medicines as well as vitamins and herbal  supplements. Some medicines that may be harmful to your baby can pass through breast milk.  It is possible to become pregnant while breastfeeding. If birth control is desired, ask your health care provider about options that will be safe while breastfeeding your baby. Where to find more information: [employer]: www.llli.org Contact a health care provider if:  You feel like you want to stop breastfeeding or have become frustrated with breastfeeding.  Your nipples are cracked or bleeding.  Your breasts are red, tender, or warm.  You have: ? Painful breasts or nipples. ? A swollen area on either breast. ? A fever or chills. ? Nausea or vomiting. ? Drainage other than breast milk from your nipples.  Your breasts do not become full before feedings by the fifth day after you give birth.  You feel sad and depressed.  Your baby is: ? Too sleepy to eat well. ? Having trouble sleeping. ? More than 42 week old and wetting fewer than 6 diapers in a 24-hour period. ? Not gaining weight by 50 days of age.  Your baby has fewer than 3 stools in a 24-hour period.  Your baby's skin or the white parts of his or her eyes become yellow. Get help right away if:  Your baby is overly tired (lethargic) and does not want to wake up and feed.  Your baby develops an unexplained fever. Summary  Breastfeeding offers many health benefits for infant and mothers.  Try to breastfeed your infant when he or she shows early signs of hunger.  Gently tickle or stroke your baby's lips with your finger or nipple to allow the baby to open his or her mouth. Bring the baby to your breast. Make sure that much of the areola is in your baby's mouth. Offer one side and burp the baby before you offer the other side.  Talk with your health care provider or lactation consultant if you have questions or you face problems as you breastfeed. This information is not intended to replace advice given to you  by your health care provider. Make sure you discuss any questions you have with your health care provider. Document Revised: 01/09/2018 Document Reviewed: 11/16/2016 Elsevier Patient Education  2021 [employer].

## 2021-02-14 NOTE — Progress Notes (Signed)
    OBSTETRICS PRENATAL VIRTUAL VISIT ENCOUNTER NOTE  Provider location: [facility] [place] [facility]   Patient location: Home  I connected with Bridget Walters on 02/14/21 at  9:15 AM EDT by MyChart Video Encounter and verified that I am speaking with the correct person using two identifiers. I discussed the limitations, risks, security and privacy concerns of performing an evaluation and management service virtually and the availability of in person appointments. I also discussed with the patient that there may be a patient responsible charge related to this service. The patient expressed understanding and agreed to proceed. Subjective:  Bridget Walters is a 35 y.o. G3P2002 at [redacted]w[redacted]d being seen today for ongoing prenatal care.  She is currently monitored for the following issues for this low-risk pregnancy and has Pain, joint, multiple sites; Back pain; Migraine headache; Routine general medical examination at a health care facility; Underweight; Supervision of other normal pregnancy, antepartum; and AMA (advanced maternal age) multigravida 35+ on their problem list.  Patient reports sinus issues, and left leg swelling.  Contractions: Not present. Vag. Bleeding: None.  Movement: Present. Denies any leaking of fluid.   The following portions of the patient's history were reviewed and updated as appropriate: allergies, current medications, past family history, past medical history, past social history, past surgical history and problem list.   Objective:   Vitals:   02/14/21 0914  BP: 123/69  Weight: 151 lb (68.5 kg)    Fetal Status:     Movement: Present     General:  Alert, oriented and cooperative. Patient is in no acute distress.  Respiratory: Normal respiratory effort, no problems with respiration noted  Mental Status: Normal mood and affect. Normal behavior. Normal judgment and thought content.  Rest of physical exam deferred due to type of  encounter  Imaging: No results found.  Assessment and Plan:  Pregnancy: G3P2002 at [redacted]w[redacted]d 1. Supervision of other normal pregnancy, antepartum Continue routine prenatal care. Leg swelling at ankle only L>R--warning signs reviewed, has varicosities.   2. Multigravida of advanced maternal age in third trimester Normal NIPT  3.  Allergies Use Neti pot, honey, nasal steroids, change from Zyrtec to allegra or Xyzal.  Preterm labor symptoms and general obstetric precautions including but not limited to vaginal bleeding, contractions, leaking of fluid and fetal movement were reviewed in detail with the patient. I discussed the assessment and treatment plan with the patient. The patient was provided an opportunity to ask questions and all were answered. The patient agreed with the plan and demonstrated an understanding of the instructions. The patient was advised to call back or seek an in-person office evaluation/go to [facility] at [facility] for any urgent or concerning symptoms. Please refer to After Visit Summary for other counseling recommendations.   I provided 12 minutes of face-to-face time during this encounter.  Return in 2 weeks (on 02/28/2021).  Future Appointments  Date Time Provider [place]  03/01/2021  2:45 PM Caren Macadam, MD [facility]  03/09/2021  3:00 PM Donnamae Jude, MD [facility]  03/15/2021 10:00 AM Caren Macadam, MD [facility]  03/21/2021 10:30 AM Brendolyn Patty, MD [facility] None  03/23/2021 10:15 AM Donnamae Jude, MD [facility]    Donnamae Jude, MD [facility] [place]

## 2021-02-14 NOTE — Progress Notes (Signed)
I connected with  Bridget Walters on 02/14/21 at  9:15 AM EDT by telephone and verified that I am speaking with the correct person using two identifiers.   I discussed the limitations, risks, security and privacy concerns of performing an evaluation and management service by telephone and the availability of in person appointments. I also discussed with the patient that there may be a patient responsible charge related to this service. The patient expressed understanding and agreed to proceed.  Crosby Oyster, RN 02/14/2021  9:16 AM    Question about med to take for allergy symptoms

## 2021-02-16 ENCOUNTER — Ambulatory Visit
Admission: EM | Admit: 2021-02-16 | Discharge: 2021-02-16 | Disposition: A | Discharge status: Home / Self Care | Payer: Medicaid Other | Attending: Emergency Medicine | Admitting: Emergency Medicine

## 2021-02-16 ENCOUNTER — Other Ambulatory Visit: Payer: Self-pay

## 2021-02-16 DIAGNOSIS — J309 Allergic rhinitis, unspecified: Secondary | ICD-10-CM

## 2021-02-16 DIAGNOSIS — Z3A34 34 weeks gestation of pregnancy: Secondary | ICD-10-CM | POA: Diagnosis present

## 2021-02-16 DIAGNOSIS — J029 Acute pharyngitis, unspecified: Secondary | ICD-10-CM | POA: Insufficient documentation

## 2021-02-16 LAB — POCT RAPID STREP A (OFFICE): Rapid Strep A Screen: NEGATIVE

## 2021-02-16 NOTE — ED Provider Notes (Signed)
Roderic Palau    CSN: [id number] Arrival date & time: 02/16/21  1351      History   Chief Complaint Chief Complaint  Patient presents with  . Sore Throat    HPI Bridget Walters is a 35 y.o. female.   Patient is currently [redacted] weeks pregnant.  She presents with 4-day history of ear pain, sore throat, congestion, sinus drainage.  She denies fever, rash, unusual shortness of breath, chest pain, abdominal pain, vomiting, diarrhea, or other symptoms.  She had a E-visit with her OB/GYN on 02/14/2021 and was instructed to take Allegra for allergies.  The history is provided by the patient and medical records.    Past Medical History:  Diagnosis Date  . Atypical mole 02/20/2016   R lower back sup/mild-mod, R lower back inf/mild-mod  . Atypical mole 03/17/2018   R lateral ankle/mild, R buttock/mild, L lateral hip/mild  . Dysplastic nevus 03/22/2020   L buttock - moderate  . history of blood in stool   . History of chicken pox   . History of fainting   . History of hay fever   . History of headache   . History of migraine   . History of urinary incontinence   . History of UTI     Patient Active Problem List   Diagnosis Date Noted  . Supervision of other normal pregnancy, antepartum 09/06/2020  . AMA (advanced maternal age) multigravida 35+ 09/06/2020  . Routine general medical examination at a health care facility 02/13/2017  . Underweight 02/13/2017  . Pain, joint, multiple sites 01/19/2014  . Back pain 01/19/2014  . Migraine headache 01/19/2014    Past Surgical History:  Procedure Laterality Date  . NO PAST SURGERIES      OB History    Gravida  3   Para  2   Term  2   Preterm  0   AB  0   Living  2     SAB  0   IAB  0   Ectopic  0   Multiple  0   Live Births  2            Home Medications    Prior to Admission medications   Medication Sig Start Date End Date Taking? Authorizing Provider  doxylamine, Sleep, (UNISOM) 25 MG  tablet Take 25 mg by mouth at bedtime as needed.   Yes [provider]  pantoprazole (PROTONIX) 20 MG tablet Take 20 mg by mouth daily.   Yes [provider]  Prenatal Vit-Fe Fumarate-FA (MULTIVITAMIN-PRENATAL) 27-0.8 MG TABS tablet Take 1 tablet by mouth daily at 12 noon.   Yes [provider]  psyllium (METAMUCIL) 58.6 % powder Take 1 packet by mouth daily. 12/26/20 03/26/21 Yes Virgel Manifold, MD  cyclobenzaprine (FLEXERIL) 10 MG tablet Take 1 tablet (10 mg total) by mouth 3 (three) times daily as needed for muscle spasms. For muscle pain , use with caution of sedation Patient not taking: No sig reported 11/12/18   Tower, Wynelle Fanny, MD  sucralfate (CARAFATE) 1 GM/10ML suspension Take 10 mLs (1 g total) by mouth 2 (two) times daily. Patient not taking: No sig reported 12/26/20 01/25/21  Virgel Manifold, MD    Family History Family History  Problem Relation Age of Onset  . Diabetes Father   . Cancer Father        skin cancer  . Hypertension Father   . Kidney disease Father  had Kidney transplant  . Diabetes Maternal Grandmother   . Arthritis Maternal Grandmother   . Diabetes Maternal Grandfather   . Hyperlipidemia Paternal Grandmother   . Cancer Paternal Grandmother   . Lung cancer Paternal Grandmother   . Prostate cancer Paternal Grandfather   . Diabetes Paternal Grandfather   . Anesthesia problems Neg Hx   . Other Neg Hx     Social History Social History   Tobacco Use  . Smoking status: Never Smoker  . Smokeless tobacco: Never Used  Vaping Use  . Vaping Use: Never used  Substance Use Topics  . Alcohol use: No  . Drug use: No     Allergies   Minocycline hcl and Sumatriptan   Review of Systems Review of Systems  Constitutional: Negative for chills and fever.  HENT: Positive for congestion, ear pain, postnasal drip and sore throat.   Eyes: Negative for pain and visual disturbance.  Respiratory: Negative for cough and shortness  of breath.   Cardiovascular: Negative for chest pain and palpitations.  Gastrointestinal: Negative for abdominal pain and vomiting.  Genitourinary: Negative for dysuria and hematuria.  Musculoskeletal: Negative for arthralgias and back pain.  Skin: Negative for color change and rash.  Neurological: Negative for seizures and syncope.  All other systems reviewed and are negative.    Physical Exam Triage Vital Signs ED Triage Vitals  Enc Vitals Group     BP      Pulse      Resp      Temp      Temp src      SpO2      Weight      Height      Head Circumference      Peak Flow      Pain Score      Pain Loc      Pain Edu?      Excl. in [place]?    No data found.  Updated Vital Signs BP 131/75 (BP Location: Left Arm)   Pulse (!) 117   Temp 97.9 F (36.6 C) (Oral)   Resp 18   LMP 06/23/2020   SpO2 98%   Visual Acuity Right Eye Distance:   Left Eye Distance:   Bilateral Distance:    Right Eye Near:   Left Eye Near:    Bilateral Near:     Physical Exam Vitals and nursing note reviewed.  Constitutional:      General: She is not in acute distress.    Appearance: She is well-developed. She is not ill-appearing.  HENT:     Head: Normocephalic and atraumatic.     Right Ear: Tympanic membrane normal.     Left Ear: Tympanic membrane normal.     Nose: Nose normal.     Mouth/Throat:     Mouth: Mucous membranes are moist.     Pharynx: Oropharynx is clear.     Comments: Clear postnasal drip. Eyes:     Conjunctiva/sclera: Conjunctivae normal.  Cardiovascular:     Rate and Rhythm: Normal rate and regular rhythm.     Heart sounds: Normal heart sounds.  Pulmonary:     Effort: Pulmonary effort is normal. No respiratory distress.     Breath sounds: Normal breath sounds.  Abdominal:     Palpations: Abdomen is soft.     Tenderness: There is no abdominal tenderness.  Musculoskeletal:     Cervical back: Neck supple.  Skin:    General: Skin is warm and dry.  Findings: No  rash.  Neurological:     General: No focal deficit present.     Mental Status: She is alert and oriented to person, place, and time.  Psychiatric:        Mood and Affect: Mood normal.        Behavior: Behavior normal.      UC Treatments / Results  Labs (all labs ordered are listed, but only abnormal results are displayed) Labs Reviewed  CULTURE, GROUP A STREP ([place])  NOVEL CORONAVIRUS, NAA  POCT RAPID STREP A (OFFICE)    EKG   Radiology No results found.  Procedures Procedures (including critical care time)  Medications Ordered in UC Medications - No data to display  Initial Impression / Assessment and Plan / UC Course  I have reviewed the triage vital signs and the nursing notes.  Pertinent labs & imaging results that were available during my care of the patient were reviewed by me and considered in my medical decision making (see chart for details).   Allergic rhinitis, sore throat.  [redacted] weeks pregnant.  Rapid strep negative; culture pending.  PCR COVID pending.  Instructed patient to self quarantine per CDC guidelines.  Instructed her to continue symptomatic treatment as directed by her OB/GYN with Allegra or Xyzal.  Instructed her to follow-up with her OB/GYN if her symptoms are not improving.  She agrees to plan of care.   Final Clinical Impressions(s) / UC Diagnoses   Final diagnoses:  Allergic rhinitis, unspecified seasonality, unspecified trigger  [redacted] weeks gestation of pregnancy  Sore throat     Discharge Instructions     Your rapid strep test is negative.  A throat culture is pending; we will call you if it is positive requiring treatment.    Your COVID test is pending.  You should self quarantine until the test result is back.    Follow-up with your OB/GYN if your symptoms are not improving.          ED Prescriptions    None     PDMP not reviewed this encounter.   Sharion Balloon, NP 02/16/21 1436

## 2021-02-16 NOTE — ED Triage Notes (Signed)
Pt presents with scratchy throat, drainage, bilateral ear pain x 4 days.  Throat feels like swallowing razor blades at night. Home COVID negative.  Took Allegra at home.

## 2021-02-16 NOTE — Discharge Instructions (Signed)
Your rapid strep test is negative.  A throat culture is pending; we will call you if it is positive requiring treatment.    Your COVID test is pending.  You should self quarantine until the test result is back.    Follow-up with your OB/GYN if your symptoms are not improving.

## 2021-02-17 ENCOUNTER — Ambulatory Visit: Payer: Self-pay

## 2021-02-17 LAB — SARS-COV-2, NAA 2 DAY TAT

## 2021-02-17 LAB — NOVEL CORONAVIRUS, NAA: SARS-CoV-2, NAA: NOT DETECTED

## 2021-02-19 LAB — CULTURE, GROUP A STREP (THRC)

## 2021-02-28 ENCOUNTER — Telehealth: Payer: Self-pay | Admitting: *Deleted

## 2021-02-28 NOTE — Telephone Encounter (Signed)
Received call from [employer] that pt had a BP of 143/75 and was nauseated with a HA. Called pt to follow up. Pt had HA last night which did get better with tylenol and did have some swelling in her ankles. Pt has a ROB tomorrow. Advised pt to increase fluid intake due to warmer weather and we are not too concerned about HA since it was better with Tylenol. If pt feels worse instead of better after resting and hydrating she will call us back. If not we weill see her tomorrow.

## 2021-03-01 ENCOUNTER — Other Ambulatory Visit (HOSPITAL_COMMUNITY)
Admission: RE | Admit: 2021-03-01 | Discharge: 2021-03-01 | Disposition: A | Discharge status: Home / Self Care | Payer: Medicaid Other | Source: Ambulatory Visit | Attending: Family Medicine | Admitting: Family Medicine

## 2021-03-01 ENCOUNTER — Ambulatory Visit (INDEPENDENT_AMBULATORY_CARE_PROVIDER_SITE_OTHER): Payer: Medicaid Other | Admitting: Family Medicine

## 2021-03-01 ENCOUNTER — Other Ambulatory Visit: Payer: Self-pay

## 2021-03-01 VITALS — BP 136/85 | HR 99 | Wt 156.4 lb

## 2021-03-01 DIAGNOSIS — Z3480 Encounter for supervision of other normal pregnancy, unspecified trimester: Secondary | ICD-10-CM | POA: Diagnosis not present

## 2021-03-01 DIAGNOSIS — R030 Elevated blood-pressure reading, without diagnosis of hypertension: Secondary | ICD-10-CM

## 2021-03-01 NOTE — Progress Notes (Signed)
   PRENATAL VISIT NOTE  Subjective:  Bridget Walters is a 35 y.o. G3P2002 at [redacted]w[redacted]d being seen today for ongoing prenatal care.  She is currently monitored for the following issues for this high-risk pregnancy and has Pain, joint, multiple sites; Back pain; Migraine headache; Routine general medical examination at a health care facility; Underweight; Supervision of other normal pregnancy, antepartum; and AMA (advanced maternal age) multigravida 35+ on their problem list.  Patient reports no complaints.  Contractions: Irritability. Vag. Bleeding: None.  Movement: Present. Denies leaking of fluid.   BS on 5/3-- has 143/75 -- HA that resolved with tylenol.   The following portions of the patient's history were reviewed and updated as appropriate: allergies, current medications, past family history, past medical history, past social history, past surgical history and problem list.   Objective:   Vitals:   03/01/21 1447 03/01/21 1458  BP: (!) 148/81 136/85  Pulse: 92 99  Weight: 156 lb 6.4 oz (70.9 kg)     Fetal Status: Fetal Heart Rate (bpm): 139   Movement: Present     General:  Alert, oriented and cooperative. Patient is in no acute distress.  Skin: Skin is warm and dry. No rash noted.   Cardiovascular: Normal heart rate noted  Respiratory: Normal respiratory effort, no problems with respiration noted  Abdomen: Soft, gravid, appropriate for gestational age.  Pain/Pressure: Present     Pelvic: Cervical exam deferred        Extremities: Normal range of motion.     Mental Status: Normal mood and affect. Normal behavior. Normal judgment and thought content.   Assessment and Plan:  Pregnancy: G3P2002 at [redacted]w[redacted]d  1. Supervision of other normal pregnancy, antepartum Up to date Reviewed BP trend with patient.   2. Elevated BP without diagnosis of hypertension Denies HA currently, changes in vision, RUQ pain, and worsening swelling. Counseled to seek care to if these change.  - BP  check in 1-2 days, if elevated again likely gHTN. Would need NST at that time and plan for IOL at 37wk. Discussed with patient plan of care if BP remains elevated - Protein / creatinine ratio, urine - Comprehensive metabolic panel - CBC   Preterm labor symptoms and general obstetric precautions including but not limited to vaginal bleeding, contractions, leaking of fluid and fetal movement were reviewed in detail with the patient. Please refer to After Visit Summary for other counseling recommendations.   Return in about 2 days (around 03/03/2021) for BP check.  Future Appointments  Date Time Provider [place]  03/03/2021 11:00 AM [facility] NURSE [facility]  03/09/2021  3:00 PM Donnamae Jude, MD [facility]  03/15/2021 10:00 AM Caren Macadam, MD [facility]  03/21/2021 10:30 AM Brendolyn Patty, MD [facility] None  03/23/2021 10:15 AM Donnamae Jude, MD [facility]    Caren Macadam, MD

## 2021-03-02 LAB — CBC
Hematocrit: 29.4 % — ABNORMAL LOW (ref 34.0–46.6)
Hemoglobin: 10.4 g/dL — ABNORMAL LOW (ref 11.1–15.9)
MCH: 29.7 pg (ref 26.6–33.0)
MCHC: 35.4 g/dL (ref 31.5–35.7)
MCV: 84 fL (ref 79–97)
Platelets: 365 10*3/uL (ref 150–450)
RBC: 3.5 x10E6/uL — ABNORMAL LOW (ref 3.77–5.28)
RDW: 12.1 % (ref 11.7–15.4)
WBC: 8 10*3/uL (ref 3.4–10.8)

## 2021-03-02 LAB — COMPREHENSIVE METABOLIC PANEL
ALT: 6 IU/L (ref 0–32)
AST: 14 IU/L (ref 0–40)
Albumin/Globulin Ratio: 1.2 (ref 1.2–2.2)
Albumin: 3.6 g/dL — ABNORMAL LOW (ref 3.8–4.8)
Alkaline Phosphatase: 249 IU/L — ABNORMAL HIGH (ref 44–121)
BUN/Creatinine Ratio: 11 (ref 9–23)
BUN: 10 mg/dL (ref 6–20)
Bilirubin Total: <0.2 mg/dL (ref 0.0–1.2)
CO2: 17 mmol/L — ABNORMAL LOW (ref 20–29)
Calcium: 9.1 mg/dL (ref 8.7–10.2)
Chloride: 104 mmol/L (ref 96–106)
Creatinine, Ser: 0.88 mg/dL (ref 0.57–1.00)
Globulin, Total: 3 g/dL (ref 1.5–4.5)
Glucose: 93 mg/dL (ref 65–99)
Potassium: 4.1 mmol/L (ref 3.5–5.2)
Sodium: 138 mmol/L (ref 134–144)
Total Protein: 6.6 g/dL (ref 6.0–8.5)
eGFR: 87 mL/min/{1.73_m2} (ref >59)

## 2021-03-02 LAB — PROTEIN / CREATININE RATIO, URINE
Creatinine, Urine: 67.1 mg/dL
Protein, Ur: 12.9 mg/dL
Protein/Creat Ratio: 192 mg/g creat (ref 0–200)

## 2021-03-03 ENCOUNTER — Ambulatory Visit (INDEPENDENT_AMBULATORY_CARE_PROVIDER_SITE_OTHER): Payer: Medicaid Other | Admitting: *Deleted

## 2021-03-03 ENCOUNTER — Other Ambulatory Visit: Payer: Self-pay

## 2021-03-03 DIAGNOSIS — Z3480 Encounter for supervision of other normal pregnancy, unspecified trimester: Secondary | ICD-10-CM

## 2021-03-03 LAB — STREP GP B NAA: Strep Gp B NAA: NEGATIVE

## 2021-03-03 LAB — GC/CHLAMYDIA PROBE AMP (~~LOC~~) NOT AT ARMC
Chlamydia: NEGATIVE
Comment: NEGATIVE
Comment: NORMAL
Neisseria Gonorrhea: NEGATIVE

## 2021-03-03 NOTE — Progress Notes (Signed)
Subjective:  Bridget Walters is a 35 y.o. female here for BP check.   Hypertension ROS: home BP monitoring in range of 144'R systolic over 15'Q diastolic, no TIA's, no chest pain on exertion, no dyspnea on exertion and noting swelling of ankles.    Objective:  BP 134/82   Pulse 83   LMP 06/23/2020   Appearance alert, well appearing, and in no distress. General exam BP noted to be well controlled today in office.    Assessment:   Blood Pressure stable.   Plan:  Dr Ernestina Patches notified, will do NST today and have pt come back on Tuesday for MD appt.Marland Kitchen

## 2021-03-06 NOTE — Progress Notes (Signed)
Attestation of Attending Supervision of clinical support staff: I agree with the care provided to this patient and was available for any consultation.  I have reviewed the RN's note and chart. I was available for consult.  Caren Macadam, MD, MPH, ABFM Attending [place] for [facility]

## 2021-03-07 ENCOUNTER — Other Ambulatory Visit: Payer: Self-pay

## 2021-03-07 ENCOUNTER — Encounter: Payer: Self-pay | Admitting: Obstetrics & Gynecology

## 2021-03-07 ENCOUNTER — Ambulatory Visit (INDEPENDENT_AMBULATORY_CARE_PROVIDER_SITE_OTHER): Payer: Medicaid Other | Admitting: Obstetrics & Gynecology

## 2021-03-07 VITALS — BP 136/89 | HR 89 | Wt 157.0 lb

## 2021-03-07 DIAGNOSIS — Z3480 Encounter for supervision of other normal pregnancy, unspecified trimester: Secondary | ICD-10-CM

## 2021-03-07 DIAGNOSIS — O163 Unspecified maternal hypertension, third trimester: Secondary | ICD-10-CM

## 2021-03-07 DIAGNOSIS — Z3A36 36 weeks gestation of pregnancy: Secondary | ICD-10-CM

## 2021-03-07 NOTE — Progress Notes (Signed)
   PRENATAL VISIT NOTE  Subjective:  Bridget Walters is a 35 y.o. G3P2002 at [redacted]w[redacted]d being seen today for ongoing prenatal care.  She is currently monitored for the following issues for this low-risk pregnancy and has Back pain; Supervision of other normal pregnancy, antepartum; and AMA (advanced maternal age) multigravida 35+ on their problem list.  Patient reports occasional contractions, desires cervical check.  Contractions: Irritability. Vag. Bleeding: None.  Movement: Present. Denies leaking of fluid.  Patient denies any headaches, visual symptoms, RUQ/epigastric pain or other concerning symptoms. Had elevated SBP last week, normal labs.  The following portions of the patient's history were reviewed and updated as appropriate: allergies, current medications, past family history, past medical history, past social history, past surgical history and problem list.   Objective:   Vitals:   03/07/21 1136  BP: 136/89  Pulse: 89  Weight: 157 lb (71.2 kg)    Fetal Status: Fetal Heart Rate (bpm): 148 Fundal Height: 35 cm Movement: Present  Presentation: Vertex  General:  Alert, oriented and cooperative. Patient is in no acute distress.  Skin: Skin is warm and dry. No rash noted.   Cardiovascular: Normal heart rate noted  Respiratory: Normal respiratory effort, no problems with respiration noted  Abdomen: Soft, gravid, appropriate for gestational age.  Pain/Pressure: Present     Pelvic: Cervical exam performed in the presence of a chaperone Dilation: 3.5 Effacement (%): 50 Station: Ballotable,-2  Extremities: Normal range of motion.  Edema: Trace  Mental Status: Normal mood and affect. Normal behavior. Normal judgment and thought content.   Assessment and Plan:  Pregnancy: G3P2002 at [redacted]w[redacted]d 1. [place] elevated blood pressure affecting pregnancy in third trimester Has not met criteria for GHTN/PEC but close.  Will recheck labs today.  PEC precautions given. Patient will come back in two  days for BP check, she is aware that she may need IOL if she meets criteria.  - Comprehensive metabolic panel - CBC - Protein / creatinine ratio, urine  2. [redacted] weeks gestation of pregnancy 3. Supervision of other normal pregnancy, antepartum Negative pelvic cultures. Favorable cervical exam.  Labor symptoms and general obstetric precautions including but not limited to vaginal bleeding, contractions, leaking of fluid and fetal movement were reviewed in detail with the patient. Please refer to After Visit Summary for other counseling recommendations.   Return in about 1 week (around 03/14/2021) for OFFICE OB VISIT (MD or APP) after BP check with RN on 03/09/2021.  Future Appointments  Date Time Provider [place]  03/15/2021 10:00 AM Caren Macadam, MD [facility]  03/21/2021 10:30 AM Brendolyn Patty, MD [facility] None  03/23/2021 10:15 AM Donnamae Jude, MD [facility]    Verita Schneiders, MD

## 2021-03-07 NOTE — Patient Instructions (Signed)
Return to office for any scheduled appointments. Call the office or go to the MAU at [facility] at  if:  You begin to have strong, frequent contractions  Your water breaks.  Sometimes it is a big gush of fluid, sometimes it is just a trickle that keeps getting your panties wet or running down your legs  You have vaginal bleeding.  It is normal to have a small amount of spotting if your cervix was checked.   You do not feel your baby moving like normal.  If you do not, get something to eat and drink and lay down and focus on feeling your baby move.   If your baby is still not moving like normal, you should call the office or go to MAU.  Any other obstetric concerns.   

## 2021-03-08 LAB — COMPREHENSIVE METABOLIC PANEL
ALT: 6 IU/L (ref 0–32)
AST: 12 IU/L (ref 0–40)
Albumin/Globulin Ratio: 1.3 (ref 1.2–2.2)
Albumin: 3.7 g/dL — ABNORMAL LOW (ref 3.8–4.8)
Alkaline Phosphatase: 245 IU/L — ABNORMAL HIGH (ref 44–121)
BUN/Creatinine Ratio: 14 (ref 9–23)
BUN: 11 mg/dL (ref 6–20)
Bilirubin Total: <0.2 mg/dL (ref 0.0–1.2)
CO2: 18 mmol/L — ABNORMAL LOW (ref 20–29)
Calcium: 9.3 mg/dL (ref 8.7–10.2)
Chloride: 103 mmol/L (ref 96–106)
Creatinine, Ser: 0.76 mg/dL (ref 0.57–1.00)
Globulin, Total: 2.9 g/dL (ref 1.5–4.5)
Glucose: 92 mg/dL (ref 65–99)
Potassium: 4.1 mmol/L (ref 3.5–5.2)
Sodium: 137 mmol/L (ref 134–144)
Total Protein: 6.6 g/dL (ref 6.0–8.5)
eGFR: 103 mL/min/{1.73_m2} (ref >59)

## 2021-03-08 LAB — PROTEIN / CREATININE RATIO, URINE
Creatinine, Urine: 149.5 mg/dL
Protein, Ur: 38 mg/dL
Protein/Creat Ratio: 254 mg/g creat — ABNORMAL HIGH (ref 0–200)

## 2021-03-08 LAB — CBC
Hematocrit: 31 % — ABNORMAL LOW (ref 34.0–46.6)
Hemoglobin: 10.6 g/dL — ABNORMAL LOW (ref 11.1–15.9)
MCH: 28.6 pg (ref 26.6–33.0)
MCHC: 34.2 g/dL (ref 31.5–35.7)
MCV: 84 fL (ref 79–97)
Platelets: 330 10*3/uL (ref 150–450)
RBC: 3.71 x10E6/uL — ABNORMAL LOW (ref 3.77–5.28)
RDW: 11.9 % (ref 11.7–15.4)
WBC: 7.2 10*3/uL (ref 3.4–10.8)

## 2021-03-09 ENCOUNTER — Inpatient Hospital Stay (HOSPITAL_COMMUNITY): Payer: Medicaid Other | Admitting: Anesthesiology

## 2021-03-09 ENCOUNTER — Other Ambulatory Visit: Payer: Self-pay

## 2021-03-09 ENCOUNTER — Inpatient Hospital Stay (HOSPITAL_COMMUNITY)
Admission: AD | Admit: 2021-03-09 | Discharge: 2021-03-10 | DRG: 807 | Disposition: A | Discharge status: Home / Self Care | Payer: Medicaid Other | Attending: Obstetrics & Gynecology | Admitting: Obstetrics & Gynecology

## 2021-03-09 ENCOUNTER — Encounter (HOSPITAL_COMMUNITY): Payer: Self-pay | Admitting: Obstetrics & Gynecology

## 2021-03-09 ENCOUNTER — Encounter: Payer: Medicaid Other | Admitting: Family Medicine

## 2021-03-09 DIAGNOSIS — Z3A37 37 weeks gestation of pregnancy: Secondary | ICD-10-CM

## 2021-03-09 DIAGNOSIS — O26893 Other specified pregnancy related conditions, third trimester: Secondary | ICD-10-CM | POA: Diagnosis present

## 2021-03-09 DIAGNOSIS — Z20822 Contact with and (suspected) exposure to covid-19: Secondary | ICD-10-CM | POA: Diagnosis present

## 2021-03-09 DIAGNOSIS — O4202 Full-term premature rupture of membranes, onset of labor within 24 hours of rupture: Secondary | ICD-10-CM

## 2021-03-09 DIAGNOSIS — O358XX0 Maternal care for other (suspected) fetal abnormality and damage, not applicable or unspecified: Secondary | ICD-10-CM | POA: Diagnosis present

## 2021-03-09 DIAGNOSIS — O09299 Supervision of pregnancy with other poor reproductive or obstetric history, unspecified trimester: Secondary | ICD-10-CM

## 2021-03-09 DIAGNOSIS — O09529 Supervision of elderly multigravida, unspecified trimester: Secondary | ICD-10-CM

## 2021-03-09 DIAGNOSIS — Z3493 Encounter for supervision of normal pregnancy, unspecified, third trimester: Secondary | ICD-10-CM

## 2021-03-09 LAB — RESP PANEL BY RT-PCR (FLU A&B, COVID) ARPGX2
Influenza A by PCR: NEGATIVE
Influenza B by PCR: NEGATIVE
SARS Coronavirus 2 by RT PCR: NEGATIVE

## 2021-03-09 LAB — RPR: RPR Ser Ql: NONREACTIVE

## 2021-03-09 LAB — TYPE AND SCREEN
ABO/RH(D): B POS
Antibody Screen: NEGATIVE

## 2021-03-09 LAB — ABO/RH: ABO/RH(D): B POS

## 2021-03-09 LAB — CBC
HCT: 31.7 % — ABNORMAL LOW (ref 36.0–46.0)
Hemoglobin: 10.6 g/dL — ABNORMAL LOW (ref 12.0–15.0)
MCH: 28.7 pg (ref 26.0–34.0)
MCHC: 33.4 g/dL (ref 30.0–36.0)
MCV: 85.9 fL (ref 80.0–100.0)
Platelets: 297 10*3/uL (ref 150–400)
RBC: 3.69 MIL/uL — ABNORMAL LOW (ref 3.87–5.11)
RDW: 12.5 % (ref 11.5–15.5)
WBC: 6.3 10*3/uL (ref 4.0–10.5)
nRBC: 0 % (ref 0.0–0.2)

## 2021-03-09 MED ORDER — FENTANYL-BUPIVACAINE-NACL 0.5-0.125-0.9 MG/250ML-% EP SOLN
EPIDURAL | Status: DC | PRN
  Administered 2021-03-09: 12 mL/h via EPIDURAL

## 2021-03-09 MED ORDER — COCONUT OIL OIL: 1.0000 "application " | TOPICAL_OIL | Status: DC | PRN

## 2021-03-09 MED ORDER — ACETAMINOPHEN 325 MG PO TABS: 650.0000 mg | ORAL_TABLET | ORAL | Status: DC | PRN

## 2021-03-09 MED ORDER — PHENYLEPHRINE 40 MCG/ML (10ML) SYRINGE FOR IV PUSH (FOR BLOOD PRESSURE SUPPORT)
80.0000 ug | PREFILLED_SYRINGE | INTRAVENOUS | Status: DC | PRN
  Filled 2021-03-09: qty 10

## 2021-03-09 MED ORDER — LIDOCAINE HCL (PF) 1 % IJ SOLN: 30.0000 mL | INTRAMUSCULAR | Status: DC | PRN

## 2021-03-09 MED ORDER — OXYCODONE-ACETAMINOPHEN 5-325 MG PO TABS: 2.0000 | ORAL_TABLET | ORAL | Status: DC | PRN

## 2021-03-09 MED ORDER — LIDOCAINE HCL (PF) 1 % IJ SOLN
INTRAMUSCULAR | Status: DC | PRN
  Administered 2021-03-09 (×2): 4 mL via EPIDURAL

## 2021-03-09 MED ORDER — LACTATED RINGERS IV SOLN: INTRAVENOUS | Status: DC

## 2021-03-09 MED ORDER — ONDANSETRON HCL 4 MG/2ML IJ SOLN: 4.0000 mg | Freq: Four times a day (QID) | INTRAMUSCULAR | Status: DC | PRN

## 2021-03-09 MED ORDER — FENTANYL-BUPIVACAINE-NACL 0.5-0.125-0.9 MG/250ML-% EP SOLN
12.0000 mL/h | EPIDURAL | Status: DC | PRN
  Filled 2021-03-09: qty 250

## 2021-03-09 MED ORDER — OXYTOCIN BOLUS FROM INFUSION
333.0000 mL | Freq: Once | INTRAVENOUS | Status: AC
  Administered 2021-03-09: 333 mL via INTRAVENOUS

## 2021-03-09 MED ORDER — MEASLES, MUMPS & RUBELLA VAC IJ SOLR: 0.5000 mL | Freq: Once | INTRAMUSCULAR | Status: DC

## 2021-03-09 MED ORDER — SIMETHICONE 80 MG PO CHEW: 80.0000 mg | CHEWABLE_TABLET | ORAL | Status: DC | PRN

## 2021-03-09 MED ORDER — OXYTOCIN-SODIUM CHLORIDE 30-0.9 UT/500ML-% IV SOLN
2.5000 [IU]/h | INTRAVENOUS | Status: DC
  Administered 2021-03-09: 2.5 [IU]/h via INTRAVENOUS
  Filled 2021-03-09: qty 500

## 2021-03-09 MED ORDER — FENTANYL CITRATE (PF) 100 MCG/2ML IJ SOLN: 100.0000 ug | INTRAMUSCULAR | Status: DC | PRN

## 2021-03-09 MED ORDER — IBUPROFEN 600 MG PO TABS
600.0000 mg | ORAL_TABLET | Freq: Four times a day (QID) | ORAL | Status: DC
  Administered 2021-03-09 – 2021-03-10 (×4): 600 mg via ORAL
  Filled 2021-03-09 (×4): qty 1

## 2021-03-09 MED ORDER — SOD CITRATE-CITRIC ACID 500-334 MG/5ML PO SOLN: 30.0000 mL | ORAL | Status: DC | PRN

## 2021-03-09 MED ORDER — WITCH HAZEL-GLYCERIN EX PADS: 1.0000 "application " | MEDICATED_PAD | CUTANEOUS | Status: DC | PRN

## 2021-03-09 MED ORDER — TRANEXAMIC ACID-NACL 1000-0.7 MG/100ML-% IV SOLN: 1000.0000 mg | INTRAVENOUS | Status: AC

## 2021-03-09 MED ORDER — LACTATED RINGERS IV SOLN
500.0000 mL | Freq: Once | INTRAVENOUS | Status: AC
  Administered 2021-03-09: 500 mL via INTRAVENOUS

## 2021-03-09 MED ORDER — SENNOSIDES-DOCUSATE SODIUM 8.6-50 MG PO TABS
2.0000 | ORAL_TABLET | ORAL | Status: DC
  Administered 2021-03-09: 2 via ORAL
  Filled 2021-03-09: qty 2

## 2021-03-09 MED ORDER — DIPHENHYDRAMINE HCL 25 MG PO CAPS: 25.0000 mg | ORAL_CAPSULE | Freq: Four times a day (QID) | ORAL | Status: DC | PRN

## 2021-03-09 MED ORDER — OXYCODONE-ACETAMINOPHEN 5-325 MG PO TABS: 1.0000 | ORAL_TABLET | ORAL | Status: DC | PRN

## 2021-03-09 MED ORDER — BENZOCAINE-MENTHOL 20-0.5 % EX AERO
1.0000 "application " | INHALATION_SPRAY | CUTANEOUS | Status: DC | PRN
  Administered 2021-03-09: 1 via TOPICAL
  Filled 2021-03-09: qty 56

## 2021-03-09 MED ORDER — DIPHENHYDRAMINE HCL 50 MG/ML IJ SOLN: 12.5000 mg | INTRAMUSCULAR | Status: DC | PRN

## 2021-03-09 MED ORDER — METHYLERGONOVINE MALEATE 0.2 MG/ML IJ SOLN
INTRAMUSCULAR | Status: AC
  Administered 2021-03-09: 0.2 mg via INTRAMUSCULAR
  Filled 2021-03-09: qty 1

## 2021-03-09 MED ORDER — ONDANSETRON HCL 4 MG/2ML IJ SOLN: 4.0000 mg | INTRAMUSCULAR | Status: DC | PRN

## 2021-03-09 MED ORDER — EPHEDRINE 5 MG/ML INJ: 10.0000 mg | INTRAVENOUS | Status: DC | PRN

## 2021-03-09 MED ORDER — LACTATED RINGERS IV SOLN: 500.0000 mL | INTRAVENOUS | Status: DC | PRN

## 2021-03-09 MED ORDER — DIBUCAINE (PERIANAL) 1 % EX OINT
1.0000 "application " | TOPICAL_OINTMENT | CUTANEOUS | Status: DC | PRN
  Administered 2021-03-09: 1 via RECTAL
  Filled 2021-03-09: qty 28

## 2021-03-09 MED ORDER — TETANUS-DIPHTH-ACELL PERTUSSIS 5-2.5-18.5 LF-MCG/0.5 IM SUSY: 0.5000 mL | PREFILLED_SYRINGE | Freq: Once | INTRAMUSCULAR | Status: DC

## 2021-03-09 MED ORDER — PHENYLEPHRINE 40 MCG/ML (10ML) SYRINGE FOR IV PUSH (FOR BLOOD PRESSURE SUPPORT): 80.0000 ug | PREFILLED_SYRINGE | INTRAVENOUS | Status: DC | PRN

## 2021-03-09 MED ORDER — ACETAMINOPHEN 325 MG PO TABS
650.0000 mg | ORAL_TABLET | ORAL | Status: DC | PRN
  Administered 2021-03-09: 650 mg via ORAL
  Filled 2021-03-09: qty 2

## 2021-03-09 MED ORDER — METHYLERGONOVINE MALEATE 0.2 MG/ML IJ SOLN: 0.2000 mg | Freq: Once | INTRAMUSCULAR | Status: AC

## 2021-03-09 MED ORDER — TRANEXAMIC ACID-NACL 1000-0.7 MG/100ML-% IV SOLN
INTRAVENOUS | Status: AC
  Administered 2021-03-09: 1000 mg via INTRAVENOUS
  Filled 2021-03-09: qty 100

## 2021-03-09 MED ORDER — PRENATAL MULTIVITAMIN CH
1.0000 | ORAL_TABLET | Freq: Every day | ORAL | Status: DC
  Administered 2021-03-09 – 2021-03-10 (×2): 1 via ORAL
  Filled 2021-03-09 (×2): qty 1

## 2021-03-09 MED ORDER — ONDANSETRON HCL 4 MG PO TABS
4.0000 mg | ORAL_TABLET | ORAL | Status: DC | PRN
Start: 2021-03-09 — End: 2021-03-10

## 2021-03-09 MED ORDER — LACTATED RINGERS IV SOLN: 500.0000 mL | Freq: Once | INTRAVENOUS | Status: DC

## 2021-03-09 NOTE — Anesthesia Preprocedure Evaluation (Signed)
Anesthesia Evaluation  Patient identified by MRN, date of birth, ID band Patient awake    Reviewed: Allergy & Precautions, Patient's Chart, lab work & pertinent test results  History of Anesthesia Complications Negative for: history of anesthetic complications  Airway Mallampati: II  TM Distance: >3 FB Neck ROM: Full    Dental no notable dental hx.    Pulmonary neg pulmonary ROS,    Pulmonary exam normal        Cardiovascular negative cardio ROS Normal cardiovascular exam     Neuro/Psych negative neurological ROS  negative psych ROS   GI/Hepatic negative GI ROS, Neg liver ROS,   Endo/Other  negative endocrine ROS  Renal/GU negative Renal ROS  negative genitourinary   Musculoskeletal negative musculoskeletal ROS (+)   Abdominal   Peds  Hematology negative hematology ROS (+)   Anesthesia Other Findings Day of surgery medications reviewed with patient.  Reproductive/Obstetrics (+) Pregnancy                             Anesthesia Physical Anesthesia Plan  ASA: II  Anesthesia Plan: Epidural   Post-op Pain Management:    Induction:   PONV Risk Score and Plan: Treatment may vary due to age or medical condition  Airway Management Planned: Natural Airway  Additional Equipment:   Intra-op Plan:   Post-operative Plan:   Informed Consent: I have reviewed the patients History and Physical, chart, labs and discussed the procedure including the risks, benefits and alternatives for the proposed anesthesia with the patient or authorized representative who has indicated his/her understanding and acceptance.       Plan Discussed with:   Anesthesia Plan Comments:         Anesthesia Quick Evaluation  

## 2021-03-09 NOTE — Discharge Summary (Signed)
Postpartum Discharge Summary    Patient Name: Bridget Walters DOB: 1985-12-02 MRN: [id number]  Date of admission: 03/09/2021 Delivery date:03/09/2021  Delivering provider: Julianne Handler  Date of discharge: 03/10/2021  Admitting diagnosis: Supervision of low-risk pregnancy, third trimester [Z34.93] Intrauterine pregnancy: [redacted]w[redacted]d     Secondary diagnosis:  Active Problems:   AMA (advanced maternal age) multigravida 35+   History of forceps delivery in prior pregnancy, currently pregnant   Supervision of low-risk pregnancy, third trimester  Additional problems: Hx of FAVD, AMA    Discharge diagnosis: Term Pregnancy Delivered             Post partum procedures:N/A Augmentation: N/A Complications: None  Hospital course: Onset of Labor With Vaginal Delivery      35 y.o. yo G3P2002 at [redacted]w[redacted]d was admitted in Latent Labor on 03/09/2021. Patient had an uncomplicated labor course as follows:  Membrane Rupture Time/Date: 2:45 AM ,03/09/2021   Delivery Method:Vaginal, Spontaneous  Episiotomy: None  Lacerations:    Patient had an uncomplicated postpartum course.  She is ambulating, tolerating a regular diet, passing flatus, and urinating well. Patient is discharged home in stable condition on 03/10/21.  Newborn Data: Birth date:03/09/2021  Birth time:11:25 AM  Gender:Female  Living status:Living  Apgars:9 ,10  Weight:   Magnesium Sulfate received: No BMZ received: No Rhophylac:N/A MMR:N/A T-DaP:Given prenatally Flu: No Transfusion:No  Physical exam  Vitals:   03/09/21 1130 03/09/21 1131 03/09/21 1138 03/09/21 1145  BP: 120/69 133/75 134/75 131/77  Pulse: (!) 214 91 89 91  Resp:      Temp:      TempSrc:      SpO2:      Weight:      Height:       General: alert, cooperative and no distress Lochia: appropriate Uterine Fundus: firm Incision: N/A DVT Evaluation: No evidence of DVT seen on physical exam. Labs: Lab Results  Component Value Date   WBC 6.3 03/09/2021    HGB 10.6 (L) 03/09/2021   HCT 31.7 (L) 03/09/2021   MCV 85.9 03/09/2021   PLT 297 03/09/2021   CMP Latest Ref Rng & Units 03/07/2021  Glucose 65 - 99 mg/dL 92  BUN 6 - 20 mg/dL 11  Creatinine 0.57 - 1.00 mg/dL 0.76  Sodium 134 - 144 mmol/L 137  Potassium 3.5 - 5.2 mmol/L 4.1  Chloride 96 - 106 mmol/L 103  CO2 20 - 29 mmol/L 18(L)  Calcium 8.7 - 10.2 mg/dL 9.3  Total Protein 6.0 - 8.5 g/dL 6.6  Total Bilirubin 0.0 - 1.2 mg/dL <0.2  Alkaline Phos 44 - 121 IU/L 245(H)  AST 0 - 40 IU/L 12  ALT 0 - 32 IU/L 6   Edinburgh Score: No flowsheet data found.   After visit meds:  Allergies as of 03/10/2021      Reactions   Minocycline Hcl Hives   Sumatriptan Nausea Only      Medication List    STOP taking these medications   cyclobenzaprine 10 MG tablet Commonly known as: FLEXERIL   doxylamine (Sleep) 25 MG tablet Commonly known as: UNISOM   psyllium 58.6 % powder Commonly known as: METAMUCIL   sucralfate 1 GM/10ML suspension Commonly known as: CARAFATE     TAKE these medications   acetaminophen 325 MG tablet Commonly known as: Tylenol Take 2 tablets (650 mg total) by mouth every 4 (four) hours as needed (for pain scale < 4).   ibuprofen 600 MG tablet Commonly known as: ADVIL Take 1  tablet (600 mg total) by mouth every 6 (six) hours.   multivitamin-prenatal 27-0.8 MG Tabs tablet Take 1 tablet by mouth daily at 12 noon.   norethindrone 0.35 MG tablet Commonly known as: Ortho Micronor Take 1 tablet (0.35 mg total) by mouth daily.   pantoprazole 20 MG tablet Commonly known as: PROTONIX Take 20 mg by mouth daily.       Discharge home in stable condition Infant Feeding: Breast Infant Disposition:home with mother Discharge instruction: per After Visit Summary and Postpartum booklet. Activity: Advance as tolerated. Pelvic rest for 6 weeks.  Diet: routine diet Future Appointments: Future Appointments  Date Time Provider [place]  03/21/2021 10:30 AM  Brendolyn Patty, MD [facility] None  04/11/2021  1:45 PM Anyanwu, Sallyanne Havers, MD [facility]   Follow up Visit:  Please schedule this patient for a In person postpartum visit in 4 weeks with the following provider: Any provider. Additional Postpartum F/U:none  Low risk pregnancy complicated by: none Delivery mode:  Vaginal, Spontaneous  Anticipated Birth Control:  POPs (prescribed at discharge)  Mallie Snooks, MSN, CNM Certified Nurse Midwife, [facility] for [employer], [place] 03/10/21 10:51 AM

## 2021-03-09 NOTE — Anesthesia Procedure Notes (Signed)
Epidural Patient location during procedure: OB Start time: 03/09/2021 6:12 AM End time: 03/09/2021 6:15 AM  Staffing Anesthesiologist: Brennan Bailey, MD Performed: anesthesiologist   Preanesthetic Checklist Completed: patient identified, IV checked, risks and benefits discussed, monitors and equipment checked, pre-op evaluation and timeout performed  Epidural Patient position: sitting Prep: DuraPrep and site prepped and draped Patient monitoring: continuous pulse ox, blood pressure and heart rate Approach: midline Location: L3-L4 Injection technique: LOR air  Needle:  Needle type: Tuohy  Needle gauge: 17 G Needle length: 9 cm Needle insertion depth: 4 cm Catheter type: closed end flexible Catheter size: 19 Gauge Catheter at skin depth: 8 cm Test dose: negative and Other (1% lidocaine)  Assessment Events: blood not aspirated, injection not painful, no injection resistance, no paresthesia and negative IV test  Additional Notes Patient identified. Risks, benefits, and alternatives discussed with patient including but not limited to bleeding, infection, nerve damage, paralysis, failed block, incomplete pain control, headache, blood pressure changes, nausea, vomiting, reactions to medication, itching, and postpartum back pain. Confirmed with bedside nurse the patient's most recent platelet count. Confirmed with patient that they are not currently taking any anticoagulation, have any bleeding history, or any family history of bleeding disorders. Patient expressed understanding and wished to proceed. All questions were answered. Sterile technique was used throughout the entire procedure. Please see nursing notes for vital signs.   Crisp LOR on first pass. Test dose was given through epidural catheter and negative prior to continuing to dose epidural or start infusion. Warning signs of high block given to the patient including shortness of breath, tingling/numbness in hands, complete  motor block, or any concerning symptoms with instructions to call for help. Patient was given instructions on fall risk and not to get out of bed. All questions and concerns addressed with instructions to call with any issues or inadequate analgesia.  Reason for block:procedure for pain

## 2021-03-09 NOTE — Progress Notes (Signed)
Bridget Walters is a 35 y.o. G3P2002 at [redacted]w[redacted]d by LMP admitted for rupture of membranes  Subjective: The patient is doing well in labor. She was comfortable with the epidural. She had some pelvic pressure and felt contractions. No other complaints.   Objective: BP 120/71   Pulse 85   Temp 98.7 F (37.1 C) (Oral)   Resp 18   Ht 5\' 7"  (1.702 m)   Wt 71.2 kg   LMP 06/23/2020   SpO2 99%   BMI 24.59 kg/m  No intake/output data recorded. No intake/output data recorded.  FHT:  FHR: 130 bpm, variability: moderate,  accelerations:  Present,  decelerations:  Absent UC:   regular, every 1.5 minutes SVE:   Dilation: 10 Effacement (%): 70 Station: Plus 1 Exam by:: Julianne Handler, CNM  Labs: Lab Results  Component Value Date   WBC 6.3 03/09/2021   HGB 10.6 (L) 03/09/2021   HCT 31.7 (L) 03/09/2021   MCV 85.9 03/09/2021   PLT 297 03/09/2021    Assessment / Plan: Spontaneous labor, progressing normally  Labor: Progressing normally Preeclampsia:  no signs or symptoms of toxicity Fetal Wellbeing:  Category I Pain Control:  Epidural I/D:  GBS negative Anticipated MOD:  NSVD  Baron Sane 03/09/2021, 9:57 AM

## 2021-03-09 NOTE — H&P (Signed)
OBSTETRIC ADMISSION HISTORY AND PHYSICAL  Bridget Walters is a 35 y.o. female G3P2002 with IUP at [redacted]w[redacted]d by L/11 presenting for SROM at 0245 on 03/09/21. She reports +FMs, No LOF, no VB, no blurry vision, headaches or peripheral edema, and RUQ pain.  She plans on breast feeding. She is undecided for birth control. She received her prenatal [facility] at [facility]   Dating: By L/11 --->  Estimated Date of Delivery: 03/30/21  Sono:  @[redacted]w[redacted]d , CWD, normal anatomy, cephalic presentation,  1884Z, 68% EFW  Prenatal History/Complications:  - H/o forceps delivery (prior BW 3912g) - AMA  Past Medical History: Past Medical History:  Diagnosis Date  . Atypical mole 02/20/2016   R lower back sup/mild-mod, R lower back inf/mild-mod  . Atypical mole 03/17/2018   R lateral ankle/mild, R buttock/mild, L lateral hip/mild  . Dysplastic nevus 03/22/2020   L buttock - moderate  . history of blood in stool   . History of chicken pox   . History of fainting   . History of hay fever   . History of headache   . History of migraine   . History of urinary incontinence   . History of UTI   . Migraine headache 01/19/2014  . Pain, joint, multiple sites 01/19/2014    Past Surgical History: Past Surgical History:  Procedure Laterality Date  . NO PAST SURGERIES      Obstetrical History: OB History    Gravida  3   Para  2   Term  2   Preterm  0   AB  0   Living  2     SAB  0   IAB  0   Ectopic  0   Multiple  0   Live Births  2           Social History Social History   Socioeconomic History  . Marital status: Married    Spouse name: Not on file  . Number of children: Not on file  . Years of education: Not on file  . Highest education level: Not on file  Occupational History  . Not on file  Tobacco Use  . Smoking status: Never Smoker  . Smokeless tobacco: Never Used  Vaping Use  . Vaping Use: Never used  Substance and Sexual Activity  . Alcohol use: No  . Drug use: No   . Sexual activity: Yes  Other Topics Concern  . Not on file  Social History Narrative   3 children    Stays home    Also cares for newborn niece (4/18)   Social Determinants of Health   Financial Resource Strain: Not on file  Food Insecurity: Not on file  Transportation Needs: Not on file  Physical Activity: Not on file  Stress: Not on file  Social Connections: Not on file    Family History: Family History  Problem Relation Age of Onset  . Diabetes Father   . Cancer Father        skin cancer  . Hypertension Father   . Kidney disease Father        had Kidney transplant  . Diabetes Maternal Grandmother   . Arthritis Maternal Grandmother   . Diabetes Maternal Grandfather   . Hyperlipidemia Paternal Grandmother   . Cancer Paternal Grandmother   . Lung cancer Paternal Grandmother   . Prostate cancer Paternal Grandfather   . Diabetes Paternal Grandfather   . Anesthesia problems Neg Hx   . Other Neg Hx  Allergies: Allergies  Allergen Reactions  . Minocycline Hcl Hives  . Sumatriptan Nausea Only    Medications Prior to Admission  Medication Sig Dispense Refill Last Dose  . pantoprazole (PROTONIX) 20 MG tablet Take 20 mg by mouth daily.   03/08/2021 at Unknown time  . Prenatal Vit-Fe Fumarate-FA (MULTIVITAMIN-PRENATAL) 27-0.8 MG TABS tablet Take 1 tablet by mouth daily at 12 noon.   03/08/2021 at Unknown time  . cyclobenzaprine (FLEXERIL) 10 MG tablet Take 1 tablet (10 mg total) by mouth 3 (three) times daily as needed for muscle spasms. For muscle pain , use with caution of sedation (Patient not taking: No sig reported) 20 tablet 0   . doxylamine, Sleep, (UNISOM) 25 MG tablet Take 25 mg by mouth at bedtime as needed.     . psyllium (METAMUCIL) 58.6 % powder Take 1 packet by mouth daily. 90 packet 0   . sucralfate (CARAFATE) 1 GM/10ML suspension Take 10 mLs (1 g total) by mouth 2 (two) times daily. (Patient not taking: No sig reported) 600 mL 0      Review of  Systems   All systems reviewed and negative except as stated in HPI  Blood pressure 129/85, pulse 92, temperature 98.4 F (36.9 C), temperature source Oral, resp. rate 16, height 5\' 7"  (1.702 m), weight 71.2 kg, last menstrual period 06/23/2020, SpO2 99 %. General appearance: alert, cooperative and appears stated age Lungs: normal WOB Heart: regular rate  Abdomen: soft, non-tender Extremities: no sign of DVT Presentation: cephalic on bedside ultrasound Fetal monitoringBaseline: 110 bpm, Variability: Good {> 6 bpm), Accelerations: Reactive and Decelerations: intermittent variable decels Uterine activity: rare contractions   Prenatal labs: ABO, Rh: B/Positive/-- (11/09 1026) Antibody: Negative (11/09 1026) Rubella: 4.28 (11/09 1026) RPR: Non Reactive (03/03 0941)  HBsAg: Negative (11/09 1026)  HIV: Non Reactive (03/03 0941)  GBS: Negative/-- (05/04 1530)  2 hr Glucola wnl Genetic screening  wnl Anatomy US wnl except for hypoplastic nasal bone  Prenatal Transfer Tool  Maternal Diabetes: No Genetic Screening: Normal Maternal Ultrasounds/Referrals: Normal except for hypoplastic nasal bone Fetal Ultrasounds or other Referrals:  None Maternal Substance Abuse:  No Significant Maternal Medications:  None Significant Maternal Lab Results: Group B Strep negative  No results found for this or any previous visit (from the past 24 hour(s)).  Patient Active Problem List   Diagnosis Date Noted  . History of forceps delivery in prior pregnancy, currently pregnant 03/09/2021  . Supervision of low-risk pregnancy, third trimester 03/09/2021  . Supervision of other normal pregnancy, antepartum 09/06/2020  . AMA (advanced maternal age) multigravida 35+ 09/06/2020  . Back pain 01/19/2014    Assessment/Plan:  Bridget Walters is a 35 y.o. G3P2002 at [redacted]w[redacted]d here for labor management s/p SROM at 0245 on 03/09/21.  #Labor: Will manage expectantly and augment as clinically  indicated. #Pain: Pt desires epidural on admission #FWB: Category 2 strip given intermittent variable decels; reassuringly, moderate variability with multiple accels #ID: GBS negative #MOF: breast #MOC: unsure; considering POPs #Circ: n/a #H/o forceps delivery: discussed shoulder dystocia precautions on admission.  Randa Ngo, MD OB Fellow, [facility] 03/09/2021 4:41 AM

## 2021-03-09 NOTE — MAU Note (Signed)
Pt presents to MAU after her water broke around 0245 am this morning.  Pt endorses +FM, denies vaginal bleeding.  States CTX are regular but not back to back.

## 2021-03-09 NOTE — Lactation Note (Signed)
This note was copied from a baby's chart. Lactation Consultation Note  Patient Name: Bridget Walters [id number] Date: 03/09/2021 Reason for consult: Initial assessment;Mother's request;Early term 37-38.6wks Age: 37 hrs   Mom experienced with breastfeeding and stated latching going well with complaints of pain with the latch. Infant resting comfortably on arrival after feeding. Mom to call for assistance with next latch.   Mom wants to [facility] and will start pumping at home.  Mom has Medela pump and ordered new pump parts.   Plan 1. To feed based on cues 8-12x in 24 hr period no more than 3 hrs without an attempt. Mom to offer both breasts during feed and look for signs of milk transfer with breast compression          2. If infant unable to latch, Mom to hand express and offer EBM via spoon or finger feeding.          3. I and O sheet reviewed.           4. [place] brochure of inpatient and outpatient services reviewed.  All questions answered at the end of the visit.   Maternal Data Has patient been taught Hand Expression?: Yes Does the patient have breastfeeding experience prior to this delivery?: Yes How long did the patient breastfeed?: 2 children for 1 year  Feeding Mother's Current Feeding Choice: Breast Milk  LATCH Score                    Lactation Tools Discussed/Used    Interventions Interventions: Breast feeding basics reviewed;Education;Position options;Expressed milk;Skin to skin;Hand express;Breast compression  Discharge Pump: Personal  Consult Status Consult Status: Follow-up Date: 03/10/21 Follow-up type: In-patient    Vandella Ord  Nicholson-Springer 03/09/2021, 7:33 PM

## 2021-03-09 NOTE — Lactation Note (Signed)
This note was copied from a baby's chart. Lactation Consultation Note  Patient Name: Girl Iviana Blasingame [id number] Date: 03/09/2021 Reason for consult: L&D Initial assessment;Early term 37-38.6wks Age:37 hours   L & D Initial Lactation Visit:  Visited with family < 1 hour after birth. Baby was STS on mother's chest, awake and alert.  Mother had not attempted breast feeding.  Assisted to latch after a couple of attempts.  Baby sucked on/off for 5 minutes.  Reassured family that lactation assistance will be available on the M/B unit.  Allowed time for family bonding.  Father and grandmother present.   Maternal Data    Feeding Mother's Current Feeding Choice: Breast Milk  LATCH Score Latch: Repeated attempts needed to sustain latch, nipple held in mouth throughout feeding, stimulation needed to elicit sucking reflex.  Audible Swallowing: None  Type of Nipple: Everted at rest and after stimulation (short shafted)  Comfort (Breast/Nipple): Soft / non-tender  Hold (Positioning): Assistance needed to correctly position infant at breast and maintain latch.  LATCH Score: 6   Lactation Tools Discussed/Used    Interventions Interventions: Assisted with latch;Skin to skin  Discharge    Consult Status Consult Status: Follow-up Date: 03/09/21 Follow-up type: In-patient    Little Ishikawa 03/09/2021, 12:31 PM

## 2021-03-10 MED ORDER — IBUPROFEN 600 MG PO TABS: 600.0000 mg | ORAL_TABLET | Freq: Four times a day (QID) | ORAL | 0 refills | Status: DC

## 2021-03-10 MED ORDER — ACETAMINOPHEN 325 MG PO TABS: 650.0000 mg | ORAL_TABLET | ORAL | 0 refills | Status: AC | PRN

## 2021-03-10 MED ORDER — NORETHINDRONE 0.35 MG PO TABS: 1.0000 | ORAL_TABLET | Freq: Every day | ORAL | 11 refills | Status: DC

## 2021-03-10 NOTE — Progress Notes (Addendum)
POSTPARTUM PROGRESS NOTE  Post Partum Day 1  Subjective:  Debria Broecker is a 35 y.o. G3P3003 s/p SVD at [redacted]w[redacted]d.  She reports she is doing well. No acute events overnight. She denies any problems with ambulating, voiding or po intake. Denies nausea or vomiting.  Pain is well controlled.  Lochia is not present.  Objective: Blood pressure 117/72, pulse 75, temperature 98 F (36.7 C), temperature source Oral, resp. rate 18, height 5\' 7"  (1.702 m), weight 71.2 kg, last menstrual period 06/23/2020, SpO2 98 %, currently breastfeeding.  Physical Exam:  General: alert, cooperative and no distress Chest: no respiratory distress Heart:regular rate, distal pulses intact Abdomen: soft, nontender,  Uterine Fundus: firm, appropriately tender DVT Evaluation: No calf swelling or tenderness Skin: warm, dry  Recent Labs    03/07/21 1208 03/09/21 0350  HGB 10.6* 10.6*  HCT 31.0* 31.7*    Assessment/Plan: Jadwiga Faidley is a 35 y.o. G3P3003 s/p SVD at [redacted]w[redacted]d.  PPD#1 - Doing well, had elevated BPs in final week of pregnancy but most recent BPs normal. No need for medication.  Routine postpartum care Contraception: Will discuss at postpartum Feeding: Breastfeeding Dispo: Plan for discharge tomorrow, 5/14.   LOS: 1 day   Student note. Patient requests discharge today during bedside conversation with CNM  Mallie Snooks, MSN, CNM Certified Nurse Midwife, [facility] for [employer], [place]

## 2021-03-10 NOTE — Discharge Instructions (Signed)

## 2021-03-10 NOTE — Lactation Note (Signed)
This note was copied from a baby's chart. Lactation Consultation Note  Patient Name: Bridget Walters [id number] Date: 03/10/2021   Age:37 hours  Pt was d/c'd before being seen by Lactation.   [name] [facility] 03/10/2021, 1:50 PM

## 2021-03-10 NOTE — Anesthesia Postprocedure Evaluation (Signed)
Anesthesia Post Note  Patient: Bridget Walters  Procedure(s) Performed: AN AD [place]     Patient location during evaluation: Mother Baby Anesthesia Type: Epidural Level of consciousness: awake Pain management: satisfactory to patient Vital Signs Assessment: post-procedure vital signs reviewed and stable Respiratory status: spontaneous breathing Cardiovascular status: stable Anesthetic complications: no   No complications documented.  Last Vitals:  Vitals:   03/09/21 2345 03/10/21 0345  BP: 111/85 117/72  Pulse: 78 75  Resp: 18 18  Temp: 36.5 C 36.7 C  SpO2: 98%     Last Pain:  Vitals:   03/10/21 0937  TempSrc:   PainSc: 0-No pain   Pain Goal:                   Casimer Lanius

## 2021-03-15 ENCOUNTER — Encounter: Payer: Medicaid Other | Admitting: Family Medicine

## 2021-03-21 ENCOUNTER — Other Ambulatory Visit: Payer: Self-pay

## 2021-03-21 ENCOUNTER — Encounter: Payer: Self-pay | Admitting: Dermatology

## 2021-03-21 ENCOUNTER — Ambulatory Visit (INDEPENDENT_AMBULATORY_CARE_PROVIDER_SITE_OTHER): Payer: Medicaid Other | Admitting: Dermatology

## 2021-03-21 DIAGNOSIS — Z86018 Personal history of other benign neoplasm: Secondary | ICD-10-CM

## 2021-03-21 DIAGNOSIS — Z1283 Encounter for screening for malignant neoplasm of skin: Secondary | ICD-10-CM | POA: Diagnosis not present

## 2021-03-21 DIAGNOSIS — D229 Melanocytic nevi, unspecified: Secondary | ICD-10-CM

## 2021-03-21 DIAGNOSIS — D225 Melanocytic nevi of trunk: Secondary | ICD-10-CM

## 2021-03-21 DIAGNOSIS — D2272 Melanocytic nevi of left lower limb, including hip: Secondary | ICD-10-CM

## 2021-03-21 DIAGNOSIS — D1800 Hemangioma unspecified site: Secondary | ICD-10-CM

## 2021-03-21 DIAGNOSIS — D2261 Melanocytic nevi of right upper limb, including shoulder: Secondary | ICD-10-CM

## 2021-03-21 DIAGNOSIS — L578 Other skin changes due to chronic exposure to nonionizing radiation: Secondary | ICD-10-CM

## 2021-03-21 NOTE — Patient Instructions (Addendum)

## 2021-03-21 NOTE — Progress Notes (Signed)
   Follow-Up Visit   Subjective  Bridget Walters is a 35 y.o. female who presents for the following: Annual Exam (Patient here for TBSE. No new or changing spots noted. She has a history of dysplastic nevi. Patient gave birth to a baby girl 10 days ago. ).   The following portions of the chart were reviewed this encounter and updated as appropriate:       Review of Systems:  No other skin or systemic complaints except as noted in HPI or Assessment and Plan.  Objective  Well appearing patient in no apparent distress; mood and affect are within normal limits.  A full examination was performed including scalp, head, eyes, ears, nose, lips, neck, chest, axillae, abdomen, back, buttocks, bilateral upper extremities, bilateral lower extremities, hands, feet, fingers, toes, fingernails, and toenails. All findings within normal limits unless otherwise noted below.  Objective  Right Buttock: 6.57mm brown macule  L spinal mid back: 2.36mm med dark brown macule   Right Posterior Upper Arm: 5.40mm brown macule R post upper arm  Left Posterior Thigh: 4.52mm med dark brown macule   Assessment & Plan  Nevus (4) Right Buttock; Right Posterior Upper Arm; Left Posterior Thigh; L spinal mid back  Benign-appearing.  Observation.  Call clinic for new or changing moles.  Recommend daily use of broad spectrum spf 30+ sunscreen to sun-exposed areas.    Skin cancer screening performed today.  Actinic Damage - chronic, secondary to cumulative UV radiation exposure/sun exposure over time - diffuse scaly erythematous macules with underlying dyspigmentation - Recommend daily broad spectrum sunscreen SPF 30+ to sun-exposed areas, reapply every 2 hours as needed.  - Recommend staying in the shade or wearing long sleeves, sun glasses (UVA+UVB protection) and wide brim hats (4-inch brim around the entire circumference of the hat). - Call for new or changing lesions.  Melanocytic Nevi - Tan-brown  and/or pink-flesh-colored symmetric macules and papules - Benign appearing on exam today - Observation - Call clinic for new or changing moles - Recommend daily use of broad spectrum spf 30+ sunscreen to sun-exposed areas.   History of Dysplastic Nevi - No evidence of recurrence today, light brown macule central of the left buttock. - Recommend regular full body skin exams - Recommend daily broad spectrum sunscreen SPF 30+ to sun-exposed areas, reapply every 2 hours as needed.  - Call if any new or changing lesions are noted between office visits  Hemangiomas - Red papules - Discussed benign nature - Observe - Call for any changes  Return in about 1 year (around 03/21/2022) for TBSE.  IJamesetta Orleans, CMA, am acting as scribe for Brendolyn Patty, MD .  Documentation: I have reviewed the above documentation for accuracy and completeness, and I agree with the above.  Brendolyn Patty MD

## 2021-03-23 ENCOUNTER — Encounter: Payer: Medicaid Other | Admitting: Family Medicine

## 2021-03-30 ENCOUNTER — Inpatient Hospital Stay (HOSPITAL_COMMUNITY): Admit: 2021-03-30 | Payer: Self-pay

## 2021-04-11 ENCOUNTER — Encounter: Payer: Self-pay | Admitting: Obstetrics & Gynecology

## 2021-04-11 ENCOUNTER — Other Ambulatory Visit: Payer: Self-pay | Admitting: Obstetrics and Gynecology

## 2021-04-11 ENCOUNTER — Ambulatory Visit (INDEPENDENT_AMBULATORY_CARE_PROVIDER_SITE_OTHER): Payer: Medicaid Other | Admitting: Obstetrics & Gynecology

## 2021-04-11 ENCOUNTER — Other Ambulatory Visit: Payer: Self-pay

## 2021-04-11 DIAGNOSIS — Z309 Encounter for contraceptive management, unspecified: Secondary | ICD-10-CM

## 2021-04-11 NOTE — Progress Notes (Signed)
Post Partum Visit Note  Ellean Firman is a 35 y.o. G64P3003 female who presents for a postpartum visit. She is 4 weeks postpartum following a normal spontaneous vaginal delivery.  I have fully reviewed the prenatal and intrapartum course. The delivery was at 37.0 gestational weeks.  Anesthesia: epidural. Postpartum course has been uncomplicated. Baby is doing well. Baby is feeding by breast. Bleeding staining only. Bowel function is normal. Bladder function is normal. Patient is not sexually active. Contraception method is oral progesterone-only contraceptive. Postpartum depression screening: negative.   The pregnancy intention screening data noted above was reviewed. Potential methods of contraception were discussed. The patient elected to proceed with Oral Contraceptive.    Edinburgh Postnatal Depression Scale - 04/11/21 1344       Edinburgh Postnatal Depression Scale:  In the Past 7 Days   I have been able to laugh and see the funny side of things. 0    I have looked forward with enjoyment to things. 0    I have blamed myself unnecessarily when things went wrong. 0    I have been anxious or worried for no good reason. 0    I have felt scared or panicky for no good reason. 0    Things have been getting on top of me. 0    I have been so unhappy that I have had difficulty sleeping. 0    I have felt sad or miserable. 0    I have been so unhappy that I have been crying. 0    The thought of harming myself has occurred to me. 0    Edinburgh Postnatal Depression Scale Total 0             Health Maintenance Due  Topic Date Due   COVID-19 Vaccine (1) Never done   Pneumococcal Vaccine 67-72 Years old (1 - PCV) Never done   Zoster Vaccines- Shingrix (1 of 2) Never done    The following portions of the patient's history were reviewed and updated as appropriate: allergies, current medications, past family history, past medical history, past social history, past surgical history,  and problem list.  Review of Systems Pertinent items noted in HPI and remainder of comprehensive ROS otherwise negative.  Objective:  BP 115/77   Pulse 98   Wt 130 lb (59 kg)   Breastfeeding Yes   BMI 20.36 kg/m    General:  alert and no distress   Breasts:  normal  Lungs: clear to auscultation bilaterally  Heart:  regular rate and rhythm, S1, S2 normal, no murmur, click, rub or gallop  Abdomen: soft, non-tender; bowel sounds normal; no masses,  no organomegaly   GU exam:  not indicated       Assessment:   Postpartum care following vaginal delivery. Normal postpartum exam.   Plan:   Essential components of care per ACOG recommendations:  1.  Mood and well being: Patient with negative depression screening today. Reviewed local resources for support.  - Patient tobacco use? No.   - hx of drug use? No.    2. Infant care and feeding:  -Patient currently breastmilk feeding? No.  -Social determinants of health (SDOH) reviewed in EPIC. No concerns.  3. Sexuality, contraception and birth spacing - Patient does not want a pregnancy in the next year.  Desired family size is 3 children.  - Reviewed forms of contraception in tiered fashion. Patient desires oral progesterone-only contraceptive, has prescription, will start soon. - Discussed birth spacing of  18 months  4. Sleep and fatigue -Encouraged family/partner/community support of 4 hrs of uninterrupted sleep to help with mood and fatigue  5. Physical Recovery  - Discussed patients delivery and complications. She describes her labor as good. - Patient had a Vaginal, no problems at delivery. Patient had a 2nd degree laceration. Perineal healing reviewed. Patient expressed understanding - Patient has urinary incontinence? No. - Patient is safe to resume physical and sexual activity in two weeks.  6.  Health Maintenance - HM due items addressed Yes - Last pap smear  Diagnosis  Date Value Ref Range Status  12/09/2018    Final   NEGATIVE FOR INTRAEPITHELIAL LESIONS OR MALIGNANCY.      Verita Schneiders, MD Center for [employer], [place]

## 2021-04-11 NOTE — Addendum Note (Signed)
Encounter addended by: Ezequiel Essex, MD on: 04/11/2021 10:59 AM  Actions taken: Letter saved

## 2021-04-19 ENCOUNTER — Ambulatory Visit: Payer: Medicaid Other | Admitting: Family Medicine

## 2021-05-10 ENCOUNTER — Other Ambulatory Visit: Payer: Self-pay | Admitting: Obstetrics and Gynecology

## 2021-07-31 ENCOUNTER — Encounter: Payer: Self-pay | Admitting: Dermatology

## 2022-01-24 ENCOUNTER — Other Ambulatory Visit: Payer: Self-pay

## 2022-01-24 ENCOUNTER — Ambulatory Visit (INDEPENDENT_AMBULATORY_CARE_PROVIDER_SITE_OTHER): Payer: Medicaid Other | Admitting: Dermatology

## 2022-01-24 DIAGNOSIS — L578 Other skin changes due to chronic exposure to nonionizing radiation: Secondary | ICD-10-CM

## 2022-01-24 DIAGNOSIS — L814 Other melanin hyperpigmentation: Secondary | ICD-10-CM

## 2022-01-24 DIAGNOSIS — L820 Inflamed seborrheic keratosis: Secondary | ICD-10-CM

## 2022-01-24 DIAGNOSIS — L700 Acne vulgaris: Secondary | ICD-10-CM | POA: Diagnosis not present

## 2022-01-24 DIAGNOSIS — Z1283 Encounter for screening for malignant neoplasm of skin: Secondary | ICD-10-CM | POA: Diagnosis not present

## 2022-01-24 DIAGNOSIS — D1800 Hemangioma unspecified site: Secondary | ICD-10-CM

## 2022-01-24 DIAGNOSIS — D229 Melanocytic nevi, unspecified: Secondary | ICD-10-CM

## 2022-01-24 DIAGNOSIS — Z86018 Personal history of other benign neoplasm: Secondary | ICD-10-CM

## 2022-01-24 DIAGNOSIS — D225 Melanocytic nevi of trunk: Secondary | ICD-10-CM | POA: Diagnosis not present

## 2022-01-24 DIAGNOSIS — L988 Other specified disorders of the skin and subcutaneous tissue: Secondary | ICD-10-CM | POA: Diagnosis not present

## 2022-01-24 DIAGNOSIS — L821 Other seborrheic keratosis: Secondary | ICD-10-CM

## 2022-01-24 NOTE — Patient Instructions (Addendum)
Cryotherapy Aftercare ? ?Wash gently with soap and water everyday.   ?Apply Vaseline and Band-Aid daily until healed.  ? ? ?Melanoma ABCDEs ? ?Melanoma is the most dangerous type of skin cancer, and is the leading cause of death from skin disease.  You are more likely to develop melanoma if you: ?Have light-colored skin, light-colored eyes, or red or blond hair ?Spend a lot of time in the sun ?Tan regularly, either outdoors or in a tanning bed ?Have had blistering sunburns, especially during childhood ?Have a close family member who has had a melanoma ?Have atypical moles or large birthmarks ? ?Early detection of melanoma is key since treatment is typically straightforward and cure rates are extremely high if we catch it early.  ? ?The first sign of melanoma is often a change in a mole or a new dark spot.  The ABCDE system is a way of remembering the signs of melanoma. ? ?A for asymmetry:  The two halves do not match. ?B for border:  The edges of the growth are irregular. ?C for color:  A mixture of colors are present instead of an even brown color. ?D for diameter:  Melanomas are usually (but not always) greater than 5m - the size of a pencil eraser. ?E for evolution:  The spot keeps changing in size, shape, and color. ? ?Please check your skin once per month between visits. You can use a small mirror in front and a large mirror behind you to keep an eye on the back side or your body.  ? ?If you see any new or changing lesions before your next follow-up, please call to schedule a visit. ? ?Please continue daily skin protection including broad spectrum sunscreen SPF 30+ to sun-exposed areas, reapplying every 2 hours as needed when you're outdoors.   ? ?If You Need Anything After Your Visit ? ?If you have any questions or concerns for your doctor, please call our main line at [phone] press option 4 to reach your doctor's medical assistant. If no one answers, please leave a voicemail as directed and we will  return your call as soon as possible. Messages left after 4 pm will be answered the following business day.  ? ?You may also send [place] message via MyChart. We typically respond to MyChart messages within 1-2 business days. ? ?For prescription refills, please ask your pharmacy to contact our office. Our fax number is [phone] ? ?If you have an urgent issue when the clinic is closed that cannot wait until the next business day, you can page your doctor at the number below.   ? ?Please note that while we do our best to be available for urgent issues outside of office hours, we are not available 24/7.  ? ?If you have an urgent issue and are unable to reach [place] you may choose to seek medical care at your doctor's office, retail clinic, urgent care center, or emergency room. ? ?If you have a medical emergency, please immediately call 911 or go to the emergency department. ? ?Pager Numbers ? ?- Dr. KNehemiah Massed [phone]? ?- Dr. MLaurence Ferrari [phone]? ?- Dr. SNicole Kindred [phone]? ?In the event of inclement weather, please call our main line at [phone] an update on the status of any delays or closures. ? ?Dermatology Medication Tips: ?Please keep the boxes that topical medications come in in order to help keep track of the instructions about where and how to use these. Pharmacies typically print the medication  instructions only on the boxes and not directly on the medication tubes.  ? ?If your medication is too expensive, please contact our office at [phone] option 4 or send [place] a message through [place].  ? ?We are unable to tell what your co-pay for medications will be in advance as this is different depending on your insurance coverage. However, we may be able to find a substitute medication at lower cost or fill out paperwork to get insurance to cover a needed medication.  ? ?If a prior authorization is required to get your medication covered by your insurance company, please allow [place] 1-2 business  days to complete this process. ? ?Drug prices often vary depending on where the prescription is filled and some pharmacies may offer cheaper prices. ? ?The website www.goodrx.com contains coupons for medications through different pharmacies. The prices here do not account for what the cost may be with help from insurance (it may be cheaper with your insurance), but the website can give you the price if you did not use any insurance.  ?- You can print the associated coupon and take it with your prescription to the pharmacy.  ?- You may also stop by our office during regular business hours and pick up a GoodRx coupon card.  ?- If you need your prescription sent electronically to a different pharmacy, notify our office through [facility] or by phone at [phone] option 4. ? ? ? ? ?Si Usted Necesita Algo Despu?s de Su Visita ? ?Tambi?n puede enviarnos un mensaje a trav?s de MyChart. Por lo general respondemos a los mensajes de MyChart en el transcurso de 1 a 2 d?as h?biles. ? ?Para renovar recetas, por favor pida a su farmacia que se ponga en contacto con nuestra oficina. Nuestro n?mero de fax es el [phone]. ? ?Si tiene un asunto urgente cuando la cl?nica est? cerrada y que no puede esperar hasta el siguiente d?a h?bil, puede llamar/localizar a su doctor(a) al n?mero que aparece a continuaci?n.  ? ?Por favor, tenga en cuenta que aunque hacemos todo lo posible para estar disponibles para asuntos urgentes fuera del horario de oficina, no estamos disponibles las 24 horas del d?a, los 7 d?as de la semana.  ? ?Si tiene un problema urgente y no puede comunicarse con nosotros, puede optar por buscar atenci?n m?dica  en el consultorio de su doctor(a), en una cl?nica privada, en un centro de atenci?n urgente o en una sala de emergencias. ? ?Si tiene Engineer, maintenance (IT) m?dica, por favor llame inmediatamente al 911 o vaya a la sala de emergencias. ? ?N?meros de b?per ? ?- Dr. Nehemiah Massed: [phone] ? ?- Dra. Moye:  [phone] ? ?- Dra. Nicole Kindred: [phone] ? ?En caso de inclemencias del tiempo, por favor llame a nuestra l?nea principal al [phone] para una actualizaci?n sobre el estado de cualquier retraso o cierre. ? ?Consejos para la medicaci?n en dermatolog?a: ?Por favor, guarde las cajas en las que vienen los medicamentos de uso t?pico para ayudarle a seguir las instrucciones sobre d?nde y c?mo usarlos. Las farmacias generalmente imprimen las instrucciones del medicamento s?lo en las cajas y no directamente en los tubos del [place].  ? ?Si su medicamento es muy caro, por favor, p?ngase en contacto con Zigmund Daniel llamando al [phone] y presione la opci?n 4 o env?enos un mensaje a trav?s de MyChart.  ? ?No podemos decirle cu?l ser? su copago por los medicamentos por adelantado ya que esto es diferente dependiendo de la cobertura de su seguro. Sin embargo,  es posible que podamos encontrar un medicamento sustituto a Electrical engineer un formulario para que el seguro cubra el medicamento que se considera necesario.  ? ?Si se requiere Ardelia Mems autorizaci?n previa para que su compa??a de seguros [place] su medicamento, por favor perm?tanos de 1 a 2 d?as h?biles para completar este proceso. ? ?Los precios de los medicamentos var?an con frecuencia dependiendo del Environmental consultant de d?nde se surte la receta y alguna farmacias pueden ofrecer precios m?s baratos. ? ?El sitio web www.goodrx.com tiene cupones para medicamentos de Airline pilot. Los precios aqu? no tienen en cuenta lo que podr?a costar con la ayuda del seguro (puede ser m?s barato con su seguro), pero el sitio web puede darle el precio si no utiliz? ning?n seguro.  ?- Puede imprimir el cup?n correspondiente y llevarlo con su receta a la farmacia.  ?- Tambi?n puede pasar por nuestra oficina durante el horario de atenci?n regular y recoger una tarjeta de cupones de GoodRx.  ?- Si necesita que su receta se env?e electr?nicamente a Chiropodist,  informe a nuestra oficina a trav?s de MyChart de [place] o por tel?fono llamando al [phone] y presione la opci?n 4. ? ?

## 2022-01-24 NOTE — Progress Notes (Signed)
? ?Follow-Up Visit ?  ?Subjective  ?Bridget Walters is a 36 y.o. female who presents for the following: Annual Exam (Patient here for full body skin exam and skin cancer screening. Patient does have hx of dysplastic nevi. She does have a spot at left upper chest.).  It is new and is a little pink.  Not clearing up. ? ?Patient does have a few nevi that we are monitoring. No changes that she has noticed. ? ?The following portions of the chart were reviewed this encounter and updated as appropriate:  ?  ?  ? ?Review of Systems:  No other skin or systemic complaints except as noted in HPI or Assessment and Plan. ? ?Objective  ?Well appearing patient in no apparent distress; mood and affect are within normal limits. ? ?A full examination was performed including scalp, head, eyes, ears, nose, lips, neck, chest, axillae, abdomen, back, buttocks, bilateral upper extremities, bilateral lower extremities, hands, feet, fingers, toes, fingernails, and toenails. All findings within normal limits unless otherwise noted below. ? ?right buttock ?Right Buttock: ?6.427m brown macule ?L spinal mid back: ?2.016mmed dark brown macule ?Right Posterior Upper Arm: ?5.27m74mrown thin papule ?Left Posterior Thigh: ?4.27mm92md dark brown macule ?Right Upper Flank: ?5 x 3 mm brown papule ?Right Hip: ?5 mm brown macule, lighter edge ?Right Shoulder: ?4 mm pink brown papule ? ? ? ? ? ? ? ? ? ? ? ? ? ? ? ? ? ? ? ? ? ? ? ? ? ? ? ? ? ? ?left upper clavicle ?waxy light gray/brown macule with mild erythema ? ?chest ?Inflammatory papules x 2, pt states occasionally gets bumps on chest, but they clear up. ? ?crows feet ?Rhytides and volume loss.  ? ? ? ?Assessment & Plan  ?Nevus ?right buttock ? ?Benign-appearing.  Observation.  Call clinic for new or changing lesions.  Recommend daily use of broad spectrum spf 30+ sunscreen to sun-exposed areas.  ? ?Previously measured nevi stable when compared. ? ?Photos today. ? ?Inflamed seborrheic  keratosis ?left upper clavicle ? ?Patient will RTC if not clearing, recheck on follow up.  ? ?Destruction of lesion - left upper clavicle ? ?Destruction method: cryotherapy   ?Informed consent: discussed and consent obtained   ?Lesion destroyed using liquid nitrogen: Yes   ?Region frozen until ice ball extended beyond lesion: Yes   ?Outcome: patient tolerated procedure well with no complications   ?Post-procedure details: wound care instructions given   ?Additional details:  Prior to procedure, discussed risks of blister formation, small wound, skin dyspigmentation, or rare scar following cryotherapy. Recommend Vaseline ointment to treated areas while healing.  ? ?Acne vulgaris ?chest ? ?Benign, observe.   ? ? ? ?Elastosis of skin ?crows feet ? ?Discussed Botox injections, topical eye creams (Alastin), sunglasses with UV protection, sunscreen spf 30 + daily to face ? ?Lentigines ?- Scattered tan macules ?- Due to sun exposure ?- Benign-appearing, observe ?- Recommend daily broad spectrum sunscreen SPF 30+ to sun-exposed areas, reapply every 2 hours as needed. ?- Call for any changes ? ?Seborrheic Keratoses ?- Stuck-on, waxy, tan-brown papules and/or plaques  ?- Benign-appearing ?- Discussed benign etiology and prognosis. ?- Observe ?- Call for any changes ? ?Melanocytic Nevi ?- Tan-brown and/or pink-flesh-colored symmetric macules and papules ?- Benign appearing on exam today ?- Observation ?- Call clinic for new or changing moles ?- Recommend daily use of broad spectrum spf 30+ sunscreen to sun-exposed areas.  ? ?Hemangiomas ?- Red papules ?- Discussed benign  nature ?- Observe ?- Call for any changes ? ?Actinic Damage ?- Chronic condition, secondary to cumulative UV/sun exposure ?- diffuse scaly erythematous macules with underlying dyspigmentation ?- Recommend daily broad spectrum sunscreen SPF 30+ to sun-exposed areas, reapply every 2 hours as needed.  ?- Staying in the shade or wearing long sleeves, sun glasses  (UVA+UVB protection) and wide brim hats (4-inch brim around the entire circumference of the hat) are also recommended for sun protection.  ?- Call for new or changing lesions. ? ?Skin cancer screening performed today. ? ?History of Dysplastic Nevi ?- No evidence of recurrence today ?- Recommend regular full body skin exams ?- Recommend daily broad spectrum sunscreen SPF 30+ to sun-exposed areas, reapply every 2 hours as needed.  ?- Call if any new or changing lesions are noted between office visits ? ?Return in about 1 year (around 01/25/2023) for TBSE. ? ?Graciella Belton, RMA, am acting as scribe for Brendolyn Patty, MD . ? ?Documentation: I have reviewed the above documentation for accuracy and completeness, and I agree with the above. ? ?Brendolyn Patty MD  ? ?

## 2022-01-29 IMAGING — US US ABDOMEN LIMITED
1 series · 14 of 25 positions shown · non-contrast
Comparison: None

CLINICAL DATA: Colicky RIGHT upper quadrant abdominal pain for 2
months

EXAM:
ULTRASOUND ABDOMEN LIMITED RIGHT UPPER QUADRANT

[Series 1: us abdomen limited ruq (liver/gb) · 14 of 53 slices shown]
[im 1/53]
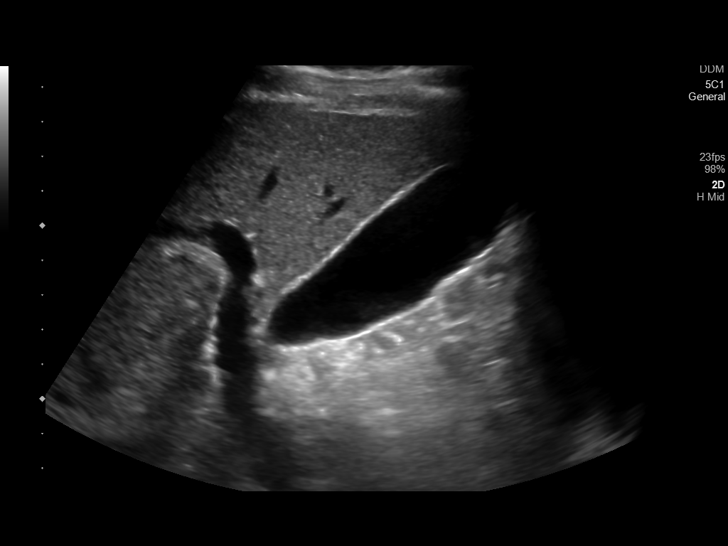
[im 5/53]
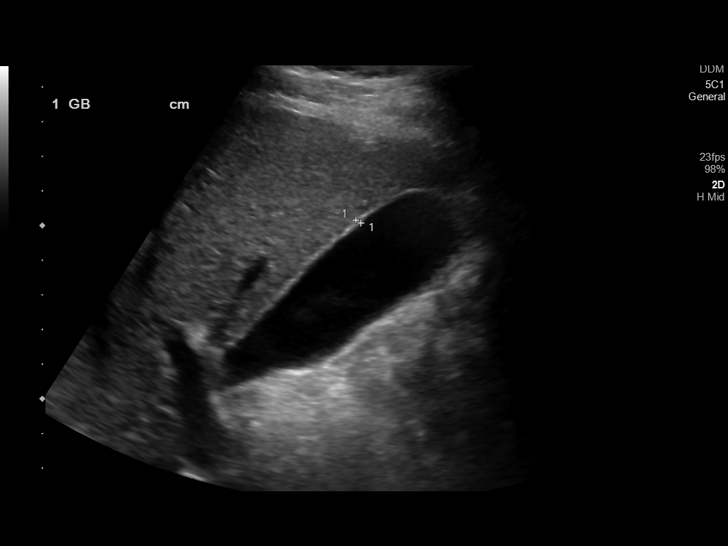
[im 9/53]
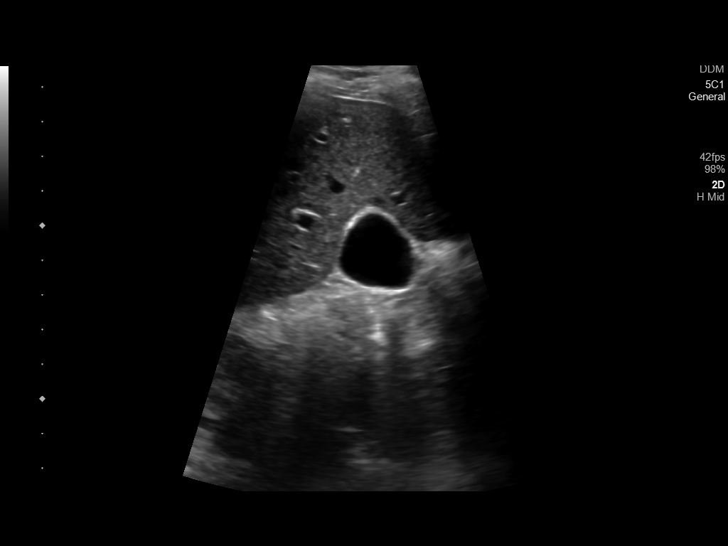
[im 14/53]
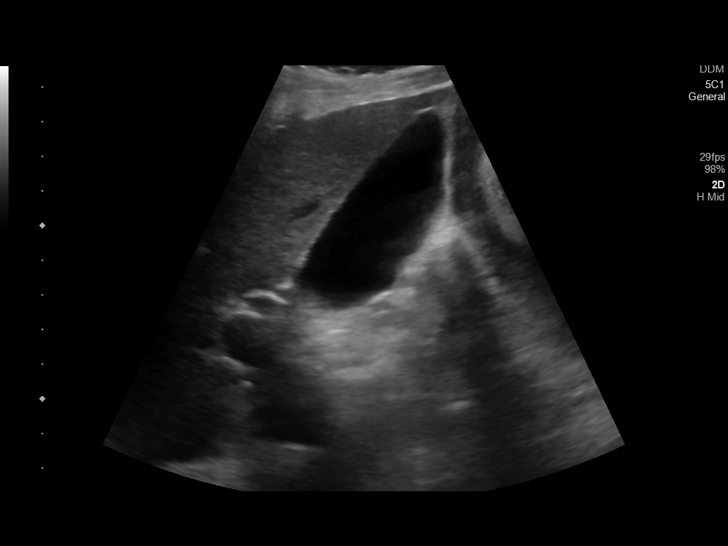
[im 18/53]
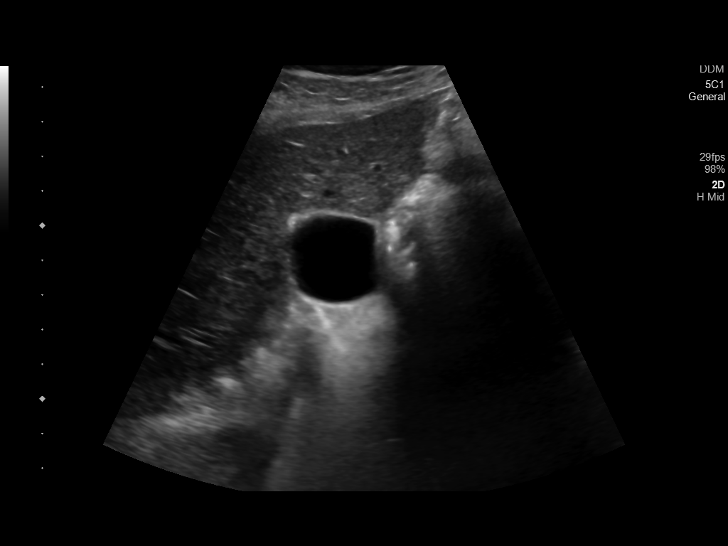
[im 20/53]
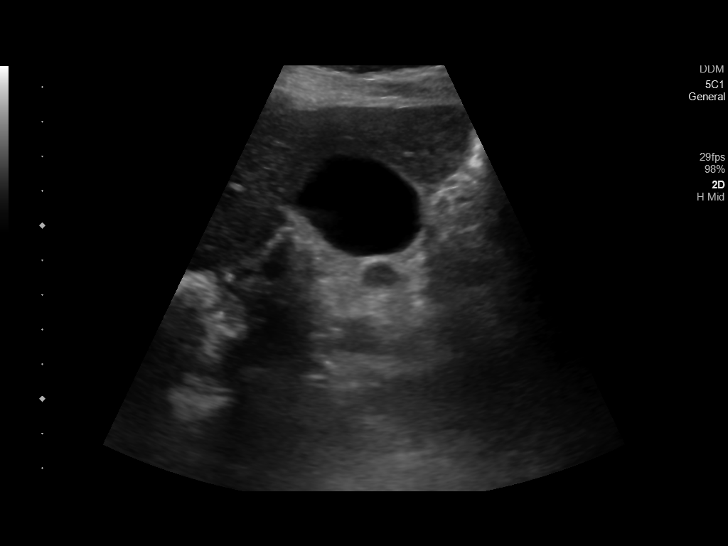
[im 24/53]
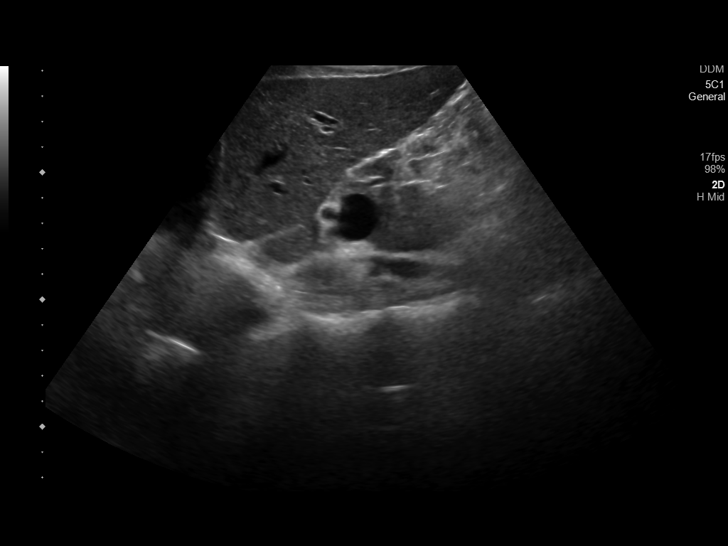
[im 29/53]
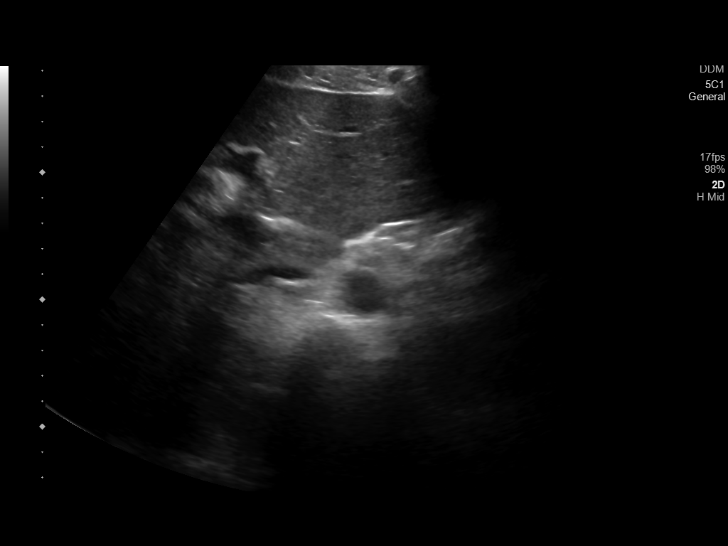
[im 33/53]
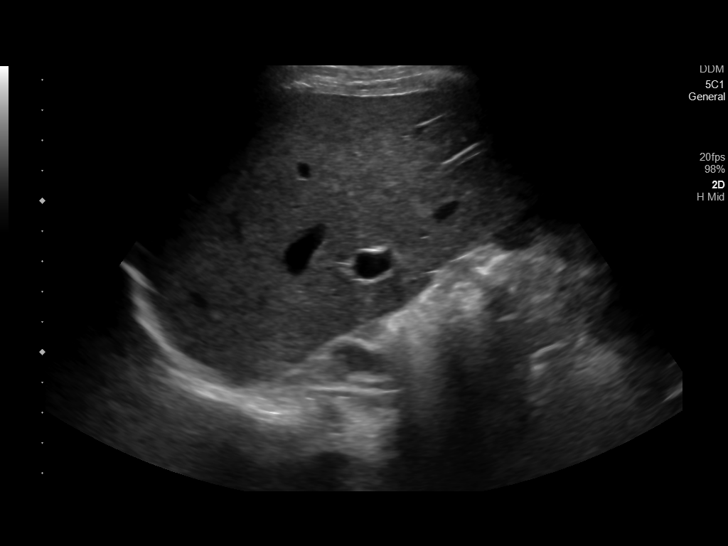
[im 35/53]
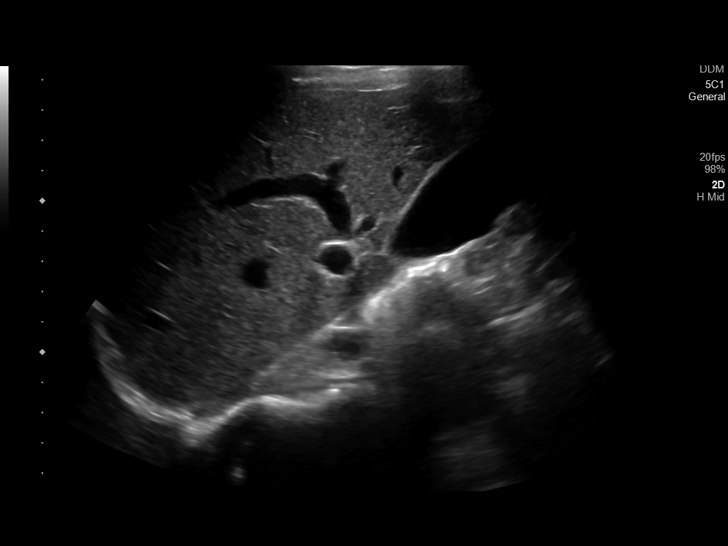
[im 40/53]
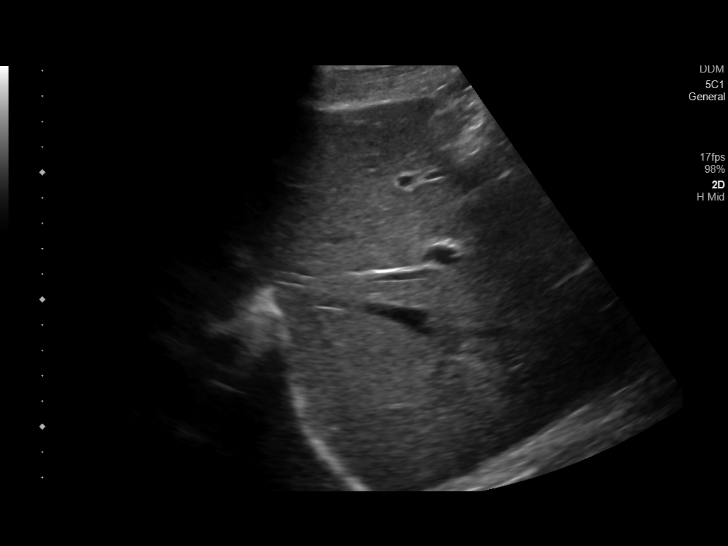
[im 44/53]
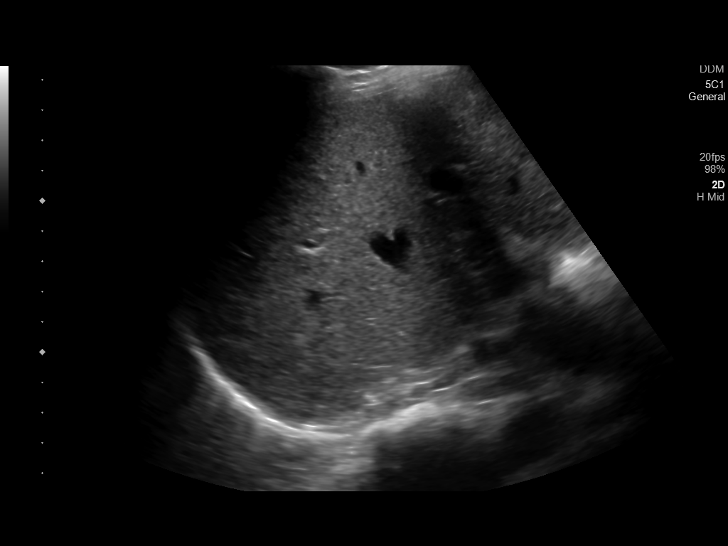
[im 48/53]
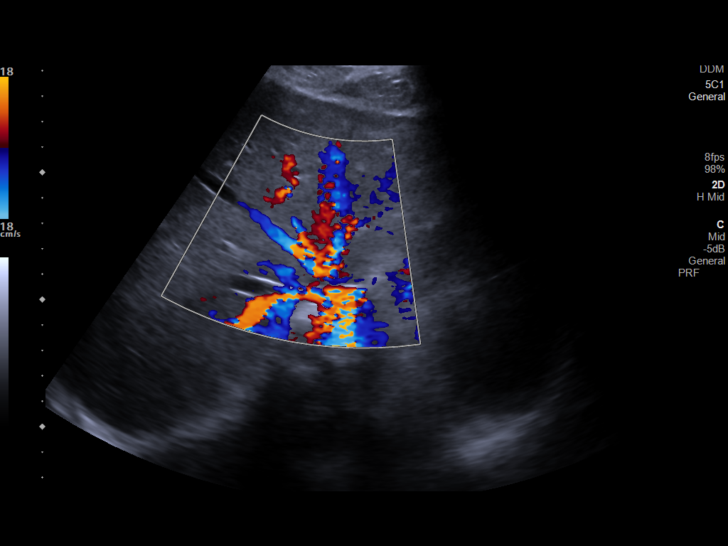
[im 53/53]
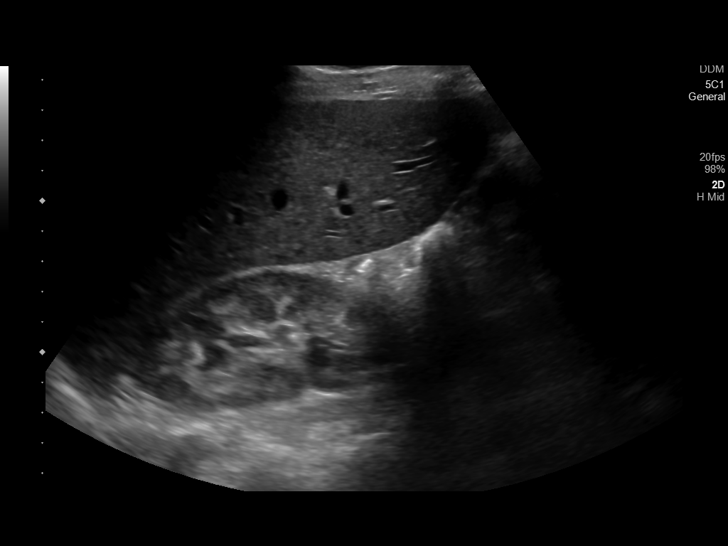

[14 of 25 positions shown; findings below may reference images not displayed]

FINDINGS: Gallbladder:

Normally distended without stones or wall thickening. No
pericholecystic fluid or sonographic Murphy sign.

Common bile duct:

Diameter: 6 mm, upper normal caliber

Liver:

Normal echogenicity without mass or nodularity. No intrahepatic
biliary dilatation. Portal vein is patent on color Doppler imaging
with normal direction of blood flow towards the liver.

Other: No RIGHT upper quadrant free fluid.
IMPRESSION: Normal exam.

## 2022-02-28 ENCOUNTER — Other Ambulatory Visit: Payer: Self-pay | Admitting: *Deleted

## 2022-02-28 ENCOUNTER — Encounter: Payer: Self-pay | Admitting: Family Medicine

## 2022-02-28 MED ORDER — NORETHINDRONE 0.35 MG PO TABS: 1.0000 | ORAL_TABLET | Freq: Every day | ORAL | 6 refills | Status: DC

## 2022-03-01 ENCOUNTER — Encounter: Payer: Self-pay | Admitting: Family Medicine

## 2022-03-02 ENCOUNTER — Encounter: Payer: Self-pay | Admitting: Family

## 2022-03-02 ENCOUNTER — Ambulatory Visit (INDEPENDENT_AMBULATORY_CARE_PROVIDER_SITE_OTHER): Payer: Medicaid Other | Admitting: Family

## 2022-03-02 VITALS — BP 108/62 | HR 94 | Temp 98.6°F | Resp 16 | Ht 67.0 in | Wt 121.2 lb

## 2022-03-02 DIAGNOSIS — N61 Mastitis without abscess: Secondary | ICD-10-CM | POA: Diagnosis not present

## 2022-03-02 MED ORDER — SULFAMETHOXAZOLE-TRIMETHOPRIM 800-160 MG PO TABS: 1.0000 | ORAL_TABLET | Freq: Two times a day (BID) | ORAL | 0 refills | Status: AC

## 2022-03-02 NOTE — Assessment & Plan Note (Signed)
Pt is lactating, baby almost one year, full term baby  ?rx bactrim 800-160 mg one po bid x 10 days  ?Heat/ice  ?Handout given to pt ?If no improvement in 48-72 hours will consider stat u/s right breast for possible abscess ?

## 2022-03-02 NOTE — Patient Instructions (Signed)
If no improvement over the weekend , let me know we will order ultrasound.  ? ?Due to recent changes in healthcare laws, you may see results of your imaging and/or laboratory studies on MyChart before I have had a chance to review them.  I understand that in some cases there may be results that are confusing or concerning to you. Please understand that not all results are received at the same time and often I may need to interpret multiple results in order to provide you with the best plan of care or course of treatment. Therefore, I ask that you please give me 2 business days to thoroughly review all your results before contacting my office for clarification. Should we see a critical lab result, you will be contacted sooner.  ? ?It was a pleasure seeing you today! Please do not hesitate to reach out with any questions and or concerns. ? ?Regards,  ? ?Kennley Schwandt ?FNP-C ? ?

## 2022-03-02 NOTE — Progress Notes (Signed)
? ?Established Patient Office Visit ? ?Subjective:  ?Patient ID: Bridget Walters, female    DOB: [birth year]  Age: 36 y.o. MRN: [id number] ? ?CC:  ?Chief Complaint  ?Patient presents with  ? Breast Pain  ?  Nursing and it hurts on the right breast like a stabbing pain. No fever  ? ? ?HPI ?Bridget Walters is here today with concerns.  ? ?Woke up two nights ago with right upper breast and right under arm tenderness.  ?When she is breastfeeding she states it is very painful around the nipple. ?She did apply heat and alternated with ice, and took ibuprofen with only mild relief.  ?However last night started with cold chills, nursing still hurts, and she is feeling breast tenderness.  ? ?Past Medical History:  ?Diagnosis Date  ? Atypical mole 02/20/2016  ? R lower back sup/mild-mod, R lower back inf/mild-mod  ? Atypical mole 03/17/2018  ? R lateral ankle/mild, R buttock/mild, L lateral hip/mild  ? Dysplastic nevus 03/22/2020  ? L buttock - moderate  ? history of blood in stool   ? History of chicken pox   ? History of fainting   ? History of hay fever   ? History of headache   ? History of migraine   ? History of urinary incontinence   ? History of UTI   ? Migraine headache 01/19/2014  ? Pain, joint, multiple sites 01/19/2014  ? ? ?Past Surgical History:  ?Procedure Laterality Date  ? NO PAST SURGERIES    ? ? ?Family History  ?Problem Relation Age of Onset  ? Diabetes Father   ? Cancer Father   ?     skin cancer  ? Hypertension Father   ? Kidney disease Father   ?     had Kidney transplant  ? Diabetes Maternal Grandmother   ? Arthritis Maternal Grandmother   ? Diabetes Maternal Grandfather   ? Hyperlipidemia Paternal Grandmother   ? Cancer Paternal Grandmother   ? Lung cancer Paternal Grandmother   ? Prostate cancer Paternal Grandfather   ? Diabetes Paternal Grandfather   ? Anesthesia problems Neg Hx   ? Other Neg Hx   ? ? ?Social History  ? ?Socioeconomic History  ? Marital status: Married  ?  Spouse name: Not on  file  ? Number of children: Not on file  ? Years of education: Not on file  ? Highest education level: Not on file  ?Occupational History  ? Not on file  ?Tobacco Use  ? Smoking status: Never  ? Smokeless tobacco: Never  ?Vaping Use  ? Vaping Use: Never used  ?Substance and Sexual Activity  ? Alcohol use: No  ? Drug use: No  ? Sexual activity: Yes  ?Other Topics Concern  ? Not on file  ?Social History Narrative  ? 3 children   ? Stays home   ? Also cares for newborn niece (4/18)  ? ?Social Determinants of Health  ? ?Financial Resource Strain: Not on file  ?Food Insecurity: Not on file  ?Transportation Needs: Not on file  ?Physical Activity: Not on file  ?Stress: Not on file  ?Social Connections: Not on file  ?Intimate Partner Violence: Not on file  ? ? ?Outpatient Medications Prior to Visit  ?Medication Sig Dispense Refill  ? ibuprofen (ADVIL) 600 MG tablet Take 1 tablet (600 mg total) by mouth every 6 (six) hours. 30 tablet 0  ? norethindrone (ORTHO MICRONOR) 0.35 MG tablet Take 1 tablet (0.35 mg total) by  mouth daily. 28 tablet 6  ? Prenatal Vit-Fe Fumarate-FA (MULTIVITAMIN-PRENATAL) 27-0.8 MG TABS tablet Take 1 tablet by mouth daily at 12 noon.    ? pantoprazole (PROTONIX) 20 MG tablet Take 20 mg by mouth daily. (Patient not taking: Reported on 04/11/2021)    ? ?No facility-administered medications prior to visit.  ? ? ?Allergies  ?Allergen Reactions  ? Minocycline Hcl Hives  ? Sumatriptan Nausea Only  ? ? ?ROS ?Review of Systems  ?Constitutional:  Positive for chills. Negative for fatigue and fever.  ?Respiratory:  Negative for cough, chest tightness, shortness of breath and wheezing.   ?Cardiovascular:  Negative for chest pain, palpitations and leg swelling.  ?Gastrointestinal:  Negative for abdominal pain.  ?Genitourinary:   ?     Right breast with redness and pain and warmth to site, right upper breast, inner to outer quadrant. Also with right axillary tenderness. Nipple tenderness with breastfeeding. No  nipple discharge.   ? ?  ?Objective:  ?  ?Physical Exam ?Constitutional:   ?   General: She is not in acute distress. ?   Appearance: Normal appearance. She is normal weight. She is not ill-appearing, toxic-appearing or diaphoretic.  ?Pulmonary:  ?   Effort: Pulmonary effort is normal.  ?Chest:  ?Breasts: ?   Right: Skin change and tenderness (right upper outer quadrant with redness, warmth, and tenderness to site.) present. No swelling, bleeding, inverted nipple, mass or nipple discharge.  ?   Left: Normal.  ? ? ?Neurological:  ?   Mental Status: She is alert.  ? ? ? ?BP 108/62   Pulse 94   Temp 98.6 ?F (37 ?C)   Resp 16   Ht $R'5\' 7"'VI$  (1.702 m)   Wt 121 lb 4 oz (55 kg)   SpO2 98%   Breastfeeding Yes   BMI 18.99 kg/m?  ?Wt Readings from Last 3 Encounters:  ?03/02/22 121 lb 4 oz (55 kg)  ?04/11/21 130 lb (59 kg)  ?03/09/21 156 lb 15.8 oz (71.2 kg)  ? ? ? ?Health Maintenance Due  ?Topic Date Due  ? COVID-19 Vaccine (1) Never done  ? PAP SMEAR-Modifier  12/09/2021  ? ? ?There are no preventive care reminders to display for this patient. ? ?Lab Results  ?Component Value Date  ? TSH 1.780 12/09/2018  ? ?Lab Results  ?Component Value Date  ? WBC 6.3 03/09/2021  ? HGB 10.6 (L) 03/09/2021  ? HCT 31.7 (L) 03/09/2021  ? MCV 85.9 03/09/2021  ? PLT 297 03/09/2021  ? ?Lab Results  ?Component Value Date  ? NA 137 03/07/2021  ? K 4.1 03/07/2021  ? CO2 18 (L) 03/07/2021  ? GLUCOSE 92 03/07/2021  ? BUN 11 03/07/2021  ? CREATININE 0.76 03/07/2021  ? BILITOT <0.2 03/07/2021  ? ALKPHOS 245 (H) 03/07/2021  ? AST 12 03/07/2021  ? ALT 6 03/07/2021  ? PROT 6.6 03/07/2021  ? ALBUMIN 3.7 (L) 03/07/2021  ? CALCIUM 9.3 03/07/2021  ? ANIONGAP 11 08/15/2020  ? EGFR 103 03/07/2021  ? GFR 94.59 02/13/2017  ? ?Lab Results  ?Component Value Date  ? HGBA1C 5.1 09/06/2020  ? ? ?  ?Assessment & Plan:  ? ?Problem List Items Addressed This Visit   ? ?  ? Other  ? Mastitis, right, acute - Primary  ?  Pt is lactating, baby almost one year, full term  baby  ?rx bactrim 800-160 mg one po bid x 10 days  ?Heat/ice  ?Handout given to pt ?If no improvement in 48-72  hours will consider stat u/s right breast for possible abscess ? ?  ?  ? Relevant Medications  ? sulfamethoxazole-trimethoprim (BACTRIM DS) 800-160 MG tablet  ? ? ?Meds ordered this encounter  ?Medications  ? sulfamethoxazole-trimethoprim (BACTRIM DS) 800-160 MG tablet  ?  Sig: Take 1 tablet by mouth 2 (two) times daily for 10 days.  ?  Dispense:  20 tablet  ?  Refill:  0  ?  Order Specific Question:   Supervising Provider  ?  Answer:   BEDSOLE, AMY E [[id number]]  ? ? ?Follow-up: Return if symptoms worsen or fail to improve with pcp.  ? ? ?Eugenia Pancoast, FNP ?

## 2022-03-14 ENCOUNTER — Telehealth: Payer: Medicaid Other | Admitting: Family Medicine

## 2022-03-14 ENCOUNTER — Other Ambulatory Visit: Payer: Self-pay | Admitting: Family

## 2022-03-14 ENCOUNTER — Encounter: Payer: Self-pay | Admitting: Family

## 2022-03-14 DIAGNOSIS — N61 Mastitis without abscess: Secondary | ICD-10-CM

## 2022-03-14 DIAGNOSIS — L0291 Cutaneous abscess, unspecified: Secondary | ICD-10-CM

## 2022-03-14 DIAGNOSIS — N611 Abscess of the breast and nipple: Secondary | ICD-10-CM

## 2022-03-14 MED ORDER — SULFAMETHOXAZOLE-TRIMETHOPRIM 800-160 MG PO TABS: 1.0000 | ORAL_TABLET | Freq: Two times a day (BID) | ORAL | 0 refills | Status: DC

## 2022-03-14 MED ORDER — SULFAMETHOXAZOLE-TRIMETHOPRIM 800-160 MG PO TABS: 1.0000 | ORAL_TABLET | Freq: Two times a day (BID) | ORAL | 0 refills | Status: AC

## 2022-03-20 ENCOUNTER — Ambulatory Visit: Payer: Medicaid Other | Admitting: Dermatology

## 2022-03-21 ENCOUNTER — Ambulatory Visit
Admission: RE | Admit: 2022-03-21 | Discharge: 2022-03-21 | Disposition: A | Discharge status: Home / Self Care | Payer: Medicaid Other | Source: Ambulatory Visit | Attending: Family | Admitting: Family

## 2022-03-21 ENCOUNTER — Other Ambulatory Visit: Payer: Self-pay | Admitting: Family

## 2022-03-21 ENCOUNTER — Ambulatory Visit: Payer: Medicaid Other

## 2022-03-21 DIAGNOSIS — L0291 Cutaneous abscess, unspecified: Secondary | ICD-10-CM

## 2022-03-21 DIAGNOSIS — N611 Abscess of the breast and nipple: Secondary | ICD-10-CM

## 2022-03-21 DIAGNOSIS — N61 Mastitis without abscess: Secondary | ICD-10-CM

## 2022-03-21 DIAGNOSIS — R921 Mammographic calcification found on diagnostic imaging of breast: Secondary | ICD-10-CM

## 2022-03-22 NOTE — Progress Notes (Signed)
FYI

## 2022-08-08 ENCOUNTER — Other Ambulatory Visit (HOSPITAL_COMMUNITY): Payer: Self-pay

## 2022-09-07 ENCOUNTER — Ambulatory Visit: Payer: Medicaid Other | Admitting: Family Medicine

## 2022-09-07 ENCOUNTER — Encounter: Payer: Self-pay | Admitting: Family Medicine

## 2022-09-07 ENCOUNTER — Ambulatory Visit (INDEPENDENT_AMBULATORY_CARE_PROVIDER_SITE_OTHER)
Admission: RE | Admit: 2022-09-07 | Discharge: 2022-09-07 | Disposition: A | Discharge status: Home / Self Care | Payer: Medicaid Other | Source: Ambulatory Visit | Attending: Family Medicine | Admitting: Family Medicine

## 2022-09-07 VITALS — BP 106/68 | HR 82 | Temp 97.9°F | Ht 67.0 in | Wt 120.2 lb

## 2022-09-07 DIAGNOSIS — G8929 Other chronic pain: Secondary | ICD-10-CM | POA: Diagnosis not present

## 2022-09-07 DIAGNOSIS — M545 Low back pain, unspecified: Secondary | ICD-10-CM

## 2022-09-07 MED ORDER — CYCLOBENZAPRINE HCL 5 MG PO TABS: 5.0000 mg | ORAL_TABLET | Freq: Three times a day (TID) | ORAL | 1 refills | Status: AC | PRN

## 2022-09-07 MED ORDER — MELOXICAM 15 MG PO TABS: 15.0000 mg | ORAL_TABLET | Freq: Every day | ORAL | 0 refills | Status: AC | PRN

## 2022-09-07 NOTE — Progress Notes (Signed)
Subjective:    Patient ID: Bridget Walters, female    DOB: 09/13/1986, 36 y.o.   MRN: [id number]  HPI Pt presents for back pain   Wt Readings from Last 3 Encounters:  09/07/22 120 lb 4 oz (54.5 kg)  03/02/22 121 lb 4 oz (55 kg)  04/11/21 130 lb (59 kg)   18.83 kg/m  Acute low back pain (has had before) This time more of a sharp pain on L side  Started sat after she pulled things out of the attick Very sharp Rad to leg - started rad to her L leg and knee   Took ibup and used heat (400 mg)  Then some tylenol- helps a bit  Eased up a bit the last day   In past chiropractor is not helpful   Some tingling in leg - yesterday went down to foot when driving   No bowel or bladder changes   Hurts to sit  Hurts to lie on the L side   Has a h/o scoliosis  Now  Hurts to sit and drive  Bending   Not a lot of time for regular exercise  Has 3 kids Active and lots of steps   Patient Active Problem List   Diagnosis Date Noted   Mastitis, right, acute 03/02/2022   Back pain 01/19/2014   Past Medical History:  Diagnosis Date   Atypical mole 02/20/2016   R lower back sup/mild-mod, R lower back inf/mild-mod   Atypical mole 03/17/2018   R lateral ankle/mild, R buttock/mild, L lateral hip/mild   Dysplastic nevus 03/22/2020   L buttock - moderate   history of blood in stool    History of chicken pox    History of fainting    History of hay fever    History of headache    History of migraine    History of urinary incontinence    History of UTI    Migraine headache 01/19/2014   Pain, joint, multiple sites 01/19/2014   Past Surgical History:  Procedure Laterality Date   NO PAST SURGERIES     Social History   Tobacco Use   Smoking status: Never   Smokeless tobacco: Never  Vaping Use   Vaping Use: Never used  Substance Use Topics   Alcohol use: No   Drug use: No   Family History  Problem Relation Age of Onset   Diabetes Father    Cancer Father        skin  cancer   Hypertension Father    Kidney disease Father        had Kidney transplant   Diabetes Maternal Grandmother    Arthritis Maternal Grandmother    Diabetes Maternal Grandfather    Hyperlipidemia Paternal Grandmother    Cancer Paternal Grandmother    Lung cancer Paternal Grandmother    Prostate cancer Paternal Grandfather    Diabetes Paternal Grandfather    Anesthesia problems Neg Hx    Other Neg Hx    Allergies  Allergen Reactions   Minocycline Hcl Hives   Sumatriptan Nausea Only   Current Outpatient Medications on File Prior to Visit  Medication Sig Dispense Refill   norethindrone (ORTHO MICRONOR) 0.35 MG tablet Take 1 tablet (0.35 mg total) by mouth daily. 28 tablet 6   Prenatal Vit-Fe Fumarate-FA (MULTIVITAMIN-PRENATAL) 27-0.8 MG TABS tablet Take 1 tablet by mouth daily at 12 noon.     No current facility-administered medications on file prior to visit.    Review of  Systems  Constitutional:  Negative for activity change, appetite change, fatigue, fever and unexpected weight change.  HENT:  Negative for congestion, ear pain, rhinorrhea, sinus pressure and sore throat.   Eyes:  Negative for pain, redness and visual disturbance.  Respiratory:  Negative for cough, shortness of breath and wheezing.   Cardiovascular:  Negative for chest pain and palpitations.  Gastrointestinal:  Negative for abdominal pain, blood in stool, constipation and diarrhea.  Endocrine: Negative for polydipsia and polyuria.  Genitourinary:  Negative for dysuria, frequency and urgency.  Musculoskeletal:  Positive for back pain. Negative for arthralgias and myalgias.  Skin:  Negative for pallor and rash.  Allergic/Immunologic: Negative for environmental allergies.  Neurological:  Positive for numbness. Negative for dizziness, syncope, weakness and headaches.  Hematological:  Negative for adenopathy. Does not bruise/bleed easily.  Psychiatric/Behavioral:  Negative for decreased concentration and  dysphoric mood. The patient is not nervous/anxious.        Objective:   Physical Exam Constitutional:      General: She is not in acute distress.    Appearance: She is well-developed.  HENT:     Head: Normocephalic and atraumatic.  Eyes:     General: No scleral icterus.    Conjunctiva/sclera: Conjunctivae normal.     Pupils: Pupils are equal, round, and reactive to light.  Cardiovascular:     Rate and Rhythm: Normal rate and regular rhythm.  Pulmonary:     Effort: Pulmonary effort is normal. No respiratory distress.  Abdominal:     General: Abdomen is flat. There is no distension.  Musculoskeletal:        General: Tenderness present. No swelling.     Cervical back: Normal range of motion and neck supple.     Lumbar back: Spasms and tenderness present. No edema, signs of trauma or bony tenderness. Decreased range of motion. Positive left straight leg raise test. Scoliosis present.     Comments: Mild thoracolumbar scoliosis  Pos SLR on the L -causing leg tingling Nl rom of spine- pain with lat bend bilat  No foot drop No neuro changes  Nl gait   Lymphadenopathy:     Cervical: No cervical adenopathy.  Skin:    General: Skin is warm and dry.     Coloration: Skin is not pale.     Findings: No erythema or rash.  Neurological:     Mental Status: She is alert.     Cranial Nerves: No cranial nerve deficit.     Sensory: No sensory deficit.     Motor: No weakness, atrophy or abnormal muscle tone.     Coordination: Coordination normal.     Deep Tendon Reflexes: Reflexes are normal and symmetric. Reflexes normal.     Comments: Negative SLR  Psychiatric:        Mood and Affect: Mood normal.           Assessment & Plan:   Problem List Items Addressed This Visit       Other   Back pain - Primary    Acute on chronic low back pain in setting of scoliosis   Now worse on the L side with some sympt of sciatica (tingling)  Px meloxicam (pt is breast feeding and need to see  if muscle relaxer is safe Continue heat/tens unit prn   Printed sciatica rehab handout Xr in light of long history  Pend result to adv futher   Rev ER parameters for emergency (severe or b/b/ change)  Relevant Medications   meloxicam (MOBIC) 15 MG tablet   Other Relevant Orders   DG Lumbar Spine Complete

## 2022-09-07 NOTE — Assessment & Plan Note (Addendum)
Acute on chronic low back pain in setting of scoliosis   Now worse on the L side with some sympt of sciatica (tingling)  Px meloxicam (pt is breast feeding and need to see if muscle relaxer is safe Continue heat/tens unit prn   Printed sciatica rehab handout Xr in light of long history  Pend result to adv futher   Rev ER parameters for emergency (severe or b/b/ change)

## 2022-09-07 NOTE — Addendum Note (Signed)
Addended by: Loura Pardon A on: 09/07/2022 04:43 PM   Modules accepted: Orders

## 2022-09-07 NOTE — Patient Instructions (Addendum)
Use heat for 10 minutes at a time  Try the meloxicam with food as needed daily for back    Start with some home stretches and exercises   Xray today  We will get back to you with that result   I will look into muscle relaxers as well

## 2022-09-11 ENCOUNTER — Encounter: Payer: Self-pay | Admitting: Family Medicine

## 2022-09-11 DIAGNOSIS — G8929 Other chronic pain: Secondary | ICD-10-CM

## 2022-09-11 NOTE — Telephone Encounter (Signed)
-----   Message from [place], [place] sent at 09/11/2022 10:02 AM EST ----- Pt seen results I spoke with her and she is willing to give PT a try she would like to go somewhere in [place]. Pt states her pain is still there it is not as intense as before but she has been dealing with this lower back pain for years.

## 2022-09-24 ENCOUNTER — Other Ambulatory Visit: Payer: Self-pay | Admitting: Family

## 2022-09-24 ENCOUNTER — Ambulatory Visit
Admission: RE | Admit: 2022-09-24 | Discharge: 2022-09-24 | Disposition: A | Discharge status: Home / Self Care | Payer: Medicaid Other | Source: Ambulatory Visit | Attending: Family | Admitting: Family

## 2022-09-24 DIAGNOSIS — R921 Mammographic calcification found on diagnostic imaging of breast: Secondary | ICD-10-CM

## 2022-09-25 NOTE — Progress Notes (Signed)
Noted. Dr. Glori Bickers, [facility].

## 2023-01-29 ENCOUNTER — Ambulatory Visit: Payer: Medicaid Other | Admitting: Dermatology

## 2023-02-12 ENCOUNTER — Ambulatory Visit: Payer: 59 | Admitting: Dermatology

## 2023-02-12 VITALS — BP 102/64 | HR 72

## 2023-02-12 DIAGNOSIS — Z1283 Encounter for screening for malignant neoplasm of skin: Secondary | ICD-10-CM | POA: Diagnosis not present

## 2023-02-12 DIAGNOSIS — L819 Disorder of pigmentation, unspecified: Secondary | ICD-10-CM

## 2023-02-12 DIAGNOSIS — L814 Other melanin hyperpigmentation: Secondary | ICD-10-CM

## 2023-02-12 DIAGNOSIS — D229 Melanocytic nevi, unspecified: Secondary | ICD-10-CM

## 2023-02-12 DIAGNOSIS — D2261 Melanocytic nevi of right upper limb, including shoulder: Secondary | ICD-10-CM

## 2023-02-12 DIAGNOSIS — D1801 Hemangioma of skin and subcutaneous tissue: Secondary | ICD-10-CM

## 2023-02-12 DIAGNOSIS — D225 Melanocytic nevi of trunk: Secondary | ICD-10-CM

## 2023-02-12 DIAGNOSIS — L578 Other skin changes due to chronic exposure to nonionizing radiation: Secondary | ICD-10-CM

## 2023-02-12 DIAGNOSIS — D2272 Melanocytic nevi of left lower limb, including hip: Secondary | ICD-10-CM

## 2023-02-12 DIAGNOSIS — L821 Other seborrheic keratosis: Secondary | ICD-10-CM

## 2023-02-12 DIAGNOSIS — Z86018 Personal history of other benign neoplasm: Secondary | ICD-10-CM | POA: Diagnosis not present

## 2023-02-12 NOTE — Patient Instructions (Addendum)
     Melanoma ABCDEs  Melanoma is the most dangerous type of skin cancer, and is the leading cause of death from skin disease.  You are more likely to develop melanoma if you: Have light-colored skin, light-colored eyes, or red or blond hair Spend a lot of time in the sun Tan regularly, either outdoors or in a tanning bed Have had blistering sunburns, especially during childhood Have a close family member who has had a melanoma Have atypical moles or large birthmarks  Early detection of melanoma is key since treatment is typically straightforward and cure rates are extremely high if we catch it early.   The first sign of melanoma is often a change in a mole or a new dark spot.  The ABCDE system is a way of remembering the signs of melanoma.  A for asymmetry:  The two halves do not match. B for border:  The edges of the growth are irregular. C for color:  A mixture of colors are present instead of an even brown color. D for diameter:  Melanomas are usually (but not always) greater than 6mm - the size of a pencil eraser. E for evolution:  The spot keeps changing in size, shape, and color.  Please check your skin once per month between visits. You can use a small mirror in front and a large mirror behind you to keep an eye on the back side or your body.   If you see any new or changing lesions before your next follow-up, please call to schedule a visit.  Please continue daily skin protection including broad spectrum sunscreen SPF 30+ to sun-exposed areas, reapplying every 2 hours as needed when you're outdoors.   Staying in the shade or wearing long sleeves, sun glasses (UVA+UVB protection) and wide brim hats (4-inch brim around the entire circumference of the hat) are also recommended for sun protection.    Due to recent changes in healthcare laws, you may see results of your pathology and/or laboratory studies on MyChart before the doctors have had a chance to review them. We  understand that in some cases there may be results that are confusing or concerning to you. Please understand that not all results are received at the same time and often the doctors may need to interpret multiple results in order to provide you with the best plan of care or course of treatment. Therefore, we ask that you please give us 2 business days to thoroughly review all your results before contacting the office for clarification. Should we see a critical lab result, you will be contacted sooner.   If You Need Anything After Your Visit  If you have any questions or concerns for your doctor, please call our main line at [phone] and press option 4 to reach your doctor's medical assistant. If no one answers, please leave a voicemail as directed and we will return your call as soon as possible. Messages left after 4 pm will be answered the following business day.   You may also send us a message via MyChart. We typically respond to MyChart messages within 1-2 business days.  For prescription refills, please ask your pharmacy to contact our office. Our fax number is [phone].  If you have an urgent issue when the clinic is closed that cannot wait until the next business day, you can page your doctor at the number below.    Please note that while we do our best to be available for urgent issues   outside of office hours, we are not available 24/7.   If you have an urgent issue and are unable to reach us, you may choose to seek medical care at your doctor's office, retail clinic, urgent care center, or emergency room.  If you have a medical emergency, please immediately call 911 or go to the emergency department.  Pager Numbers  - Dr. Kowalski: [phone]  - Dr. Moye: [phone]  - Dr. Stewart: [phone]  In the event of inclement weather, please call our main line at [phone] for an update on the status of any delays or closures.  Dermatology Medication Tips: Please  keep the boxes that topical medications come in in order to help keep track of the instructions about where and how to use these. Pharmacies typically print the medication instructions only on the boxes and not directly on the medication tubes.   If your medication is too expensive, please contact our office at [phone] option 4 or send us a message through MyChart.   We are unable to tell what your co-pay for medications will be in advance as this is different depending on your insurance coverage. However, we may be able to find a substitute medication at lower cost or fill out paperwork to get insurance to cover a needed medication.   If a prior authorization is required to get your medication covered by your insurance company, please allow us 1-2 business days to complete this process.  Drug prices often vary depending on where the prescription is filled and some pharmacies may offer cheaper prices.  The website www.goodrx.com contains coupons for medications through different pharmacies. The prices here do not account for what the cost may be with help from insurance (it may be cheaper with your insurance), but the website can give you the price if you did not use any insurance.  - You can print the associated coupon and take it with your prescription to the pharmacy.  - You may also stop by our office during regular business hours and pick up a GoodRx coupon card.  - If you need your prescription sent electronically to a different pharmacy, notify our office through [facility] MyChart or by phone at [phone] option 4.     Si Usted Necesita Algo Despus de Su Visita  Tambin puede enviarnos un mensaje a travs de MyChart. Por lo general respondemos a los mensajes de MyChart en el transcurso de 1 a 2 das hbiles.  Para renovar recetas, por favor pida a su farmacia que se ponga en contacto con nuestra oficina. Nuestro nmero de fax es el [phone].  Si tiene un asunto urgente  cuando la clnica est cerrada y que no puede esperar hasta el siguiente da hbil, puede llamar/localizar a su doctor(a) al nmero que aparece a continuacin.   Por favor, tenga en cuenta que aunque hacemos todo lo posible para estar disponibles para asuntos urgentes fuera del horario de oficina, no estamos disponibles las 24 horas del da, los 7 das de la semana.   Si tiene un problema urgente y no puede comunicarse con nosotros, puede optar por buscar atencin mdica  en el consultorio de su doctor(a), en una clnica privada, en un centro de atencin urgente o en una sala de emergencias.  Si tiene una emergencia mdica, por favor llame inmediatamente al 911 o vaya a la sala de emergencias.  Nmeros de bper  - Dr. Kowalski: [phone]  - Dra. Moye: [phone]  - Dra. Stewart: [phone]  En caso   de inclemencias del tiempo, por favor llame a nuestra lnea principal al [phone] para una actualizacin sobre el estado de cualquier retraso o cierre.  Consejos para la medicacin en dermatologa: Por favor, guarde las cajas en las que vienen los medicamentos de uso tpico para ayudarle a seguir las instrucciones sobre dnde y cmo usarlos. Las farmacias generalmente imprimen las instrucciones del medicamento slo en las cajas y no directamente en los tubos del medicamento.   Si su medicamento es muy caro, por favor, pngase en contacto con nuestra oficina llamando al [phone] y presione la opcin 4 o envenos un mensaje a travs de MyChart.   No podemos decirle cul ser su copago por los medicamentos por adelantado ya que esto es diferente dependiendo de la cobertura de su seguro. Sin embargo, es posible que podamos encontrar un medicamento sustituto a menor costo o llenar un formulario para que el seguro cubra el medicamento que se considera necesario.   Si se requiere una autorizacin previa para que su compaa de seguros cubra su medicamento, por favor permtanos de 1 a 2  das hbiles para completar este proceso.  Los precios de los medicamentos varan con frecuencia dependiendo del lugar de dnde se surte la receta y alguna farmacias pueden ofrecer precios ms baratos.  El sitio web www.goodrx.com tiene cupones para medicamentos de diferentes farmacias. Los precios aqu no tienen en cuenta lo que podra costar con la ayuda del seguro (puede ser ms barato con su seguro), pero el sitio web puede darle el precio si no utiliz ningn seguro.  - Puede imprimir el cupn correspondiente y llevarlo con su receta a la farmacia.  - Tambin puede pasar por nuestra oficina durante el horario de atencin regular y recoger una tarjeta de cupones de GoodRx.  - Si necesita que su receta se enve electrnicamente a una farmacia diferente, informe a nuestra oficina a travs de MyChart de [facility] o por telfono llamando al [phone] y presione la opcin 4.  

## 2023-02-12 NOTE — Progress Notes (Signed)
   Follow-Up Visit   Subjective  Bridget Walters is a 37 y.o. female who presents for the following: Skin Cancer Screening and Full Body Skin Exam  The patient presents for Total-Body Skin Exam (TBSE) for skin cancer screening and mole check. The patient has spots, moles and lesions to be evaluated, some may be new or changing and the patient has concerns that these could be cancer.   The following portions of the chart were reviewed this encounter and updated as appropriate: medications, allergies, medical history  Review of Systems:  No other skin or systemic complaints except as noted in HPI or Assessment and Plan.  Objective  Well appearing patient in no apparent distress; mood and affect are within normal limits.  A full examination was performed including scalp, head, eyes, ears, nose, lips, neck, chest, axillae, abdomen, back, buttocks, bilateral upper extremities, bilateral lower extremities, hands, feet, fingers, toes, fingernails, and toenails. All findings within normal limits unless otherwise noted below.   Relevant physical exam findings are noted in the Assessment and Plan.    Assessment & Plan   Hypopigmentation, possible Halo nevus  Exam:  1.5 x 1 cm hypopigmented patch at left spinal mid upper back   Treatment Plan: Benign appearing, observation  LENTIGINES, SEBORRHEIC KERATOSIS, HEMANGIOMAS - Benign normal skin lesions - Benign-appearing - Call for any changes  MELANOCYTIC NEVI - Tan-brown and/or pink-flesh-colored symmetric macules and papules - Benign appearing on exam today - Observation - Call clinic for new or changing moles - Recommend daily use of broad spectrum spf 30+ sunscreen to sun-exposed areas.  - compared with prior photos, no changes  Right Buttock: 6.70mm med brown macule  L spinal mid back: 2.13mm med dark brown macule and adjacent 1 mm med dark brown macule   Right Posterior Upper Arm: 5.60mm brown thin papule  Left Posterior  Thigh: 4.70mm med dark brown macule  Right Upper Flank: 5 x 3 mm speckled brown papule   Right Hip: 5 mm brown macule, lighter edge  Benign-appearing. Stable compared to previous visit. Observation.  Call clinic for new or changing moles.  Recommend daily use of broad spectrum spf 30+ sunscreen to sun-exposed areas.     ACTINIC DAMAGE - Chronic condition, secondary to cumulative UV/sun exposure - diffuse scaly erythematous macules with underlying dyspigmentation - Recommend daily broad spectrum sunscreen SPF 30+ to sun-exposed areas, reapply every 2 hours as needed.  - Staying in the shade or wearing long sleeves, sun glasses (UVA+UVB protection) and wide brim hats (4-inch brim around the entire circumference of the hat) are also recommended for sun protection.  - Call for new or changing lesions.  HISTORY OF DYSPLASTIC NEVUS Left buttock moderate 02/2020 See history for other  No evidence of recurrence today Recommend regular full body skin exams Recommend daily broad spectrum sunscreen SPF 30+ to sun-exposed areas, reapply every 2 hours as needed.  Call if any new or changing lesions are noted between office visits  SKIN CANCER SCREENING PERFORMED TODAY.   Return in about 1 year (around 02/12/2024) for TBSE.  I, Asher Muir, CMA, am acting as scribe for Willeen Niece, MD.   Documentation: I have reviewed the above documentation for accuracy and completeness, and I agree with the above.  Willeen Niece, MD

## 2023-02-21 ENCOUNTER — Encounter: Payer: Self-pay | Admitting: Dermatology

## 2023-02-26 ENCOUNTER — Other Ambulatory Visit: Payer: Self-pay

## 2023-02-26 MED ORDER — BIMATOPROST 0.03 % EX SOLN: 1.0000 | Freq: Every day | CUTANEOUS | 1 refills | Status: AC

## 2023-02-26 NOTE — Progress Notes (Signed)
Latisse sent to [facility]

## 2023-03-04 ENCOUNTER — Other Ambulatory Visit: Payer: Self-pay | Admitting: Obstetrics & Gynecology

## 2023-04-11 ENCOUNTER — Ambulatory Visit
Admission: RE | Admit: 2023-04-11 | Discharge: 2023-04-11 | Disposition: A | Discharge status: Home / Self Care | Payer: 59 | Source: Ambulatory Visit | Attending: Family | Admitting: Family

## 2023-04-11 DIAGNOSIS — R921 Mammographic calcification found on diagnostic imaging of breast: Secondary | ICD-10-CM

## 2023-04-11 NOTE — Progress Notes (Signed)
noted 

## 2023-05-23 ENCOUNTER — Encounter: Payer: Self-pay | Admitting: Dermatology

## 2023-08-12 ENCOUNTER — Encounter: Payer: Self-pay | Admitting: Family Medicine

## 2023-08-20 NOTE — Telephone Encounter (Signed)
Mother picked up for daughter

## 2024-02-09 ENCOUNTER — Other Ambulatory Visit: Payer: Self-pay | Admitting: Obstetrics & Gynecology

## 2024-02-11 ENCOUNTER — Ambulatory Visit: Payer: 59 | Admitting: Dermatology

## 2024-02-19 ENCOUNTER — Encounter: Payer: Self-pay | Admitting: Dermatology

## 2024-02-19 ENCOUNTER — Ambulatory Visit (INDEPENDENT_AMBULATORY_CARE_PROVIDER_SITE_OTHER): Admitting: Dermatology

## 2024-02-19 DIAGNOSIS — D2271 Melanocytic nevi of right lower limb, including hip: Secondary | ICD-10-CM

## 2024-02-19 DIAGNOSIS — D2272 Melanocytic nevi of left lower limb, including hip: Secondary | ICD-10-CM

## 2024-02-19 DIAGNOSIS — D225 Melanocytic nevi of trunk: Secondary | ICD-10-CM

## 2024-02-19 DIAGNOSIS — L578 Other skin changes due to chronic exposure to nonionizing radiation: Secondary | ICD-10-CM

## 2024-02-19 DIAGNOSIS — L720 Epidermal cyst: Secondary | ICD-10-CM

## 2024-02-19 DIAGNOSIS — L814 Other melanin hyperpigmentation: Secondary | ICD-10-CM

## 2024-02-19 DIAGNOSIS — W908XXA Exposure to other nonionizing radiation, initial encounter: Secondary | ICD-10-CM | POA: Diagnosis not present

## 2024-02-19 DIAGNOSIS — Z86018 Personal history of other benign neoplasm: Secondary | ICD-10-CM

## 2024-02-19 DIAGNOSIS — Z1283 Encounter for screening for malignant neoplasm of skin: Secondary | ICD-10-CM | POA: Diagnosis not present

## 2024-02-19 DIAGNOSIS — L821 Other seborrheic keratosis: Secondary | ICD-10-CM

## 2024-02-19 DIAGNOSIS — D2261 Melanocytic nevi of right upper limb, including shoulder: Secondary | ICD-10-CM

## 2024-02-19 DIAGNOSIS — L729 Follicular cyst of the skin and subcutaneous tissue, unspecified: Secondary | ICD-10-CM

## 2024-02-19 DIAGNOSIS — D229 Melanocytic nevi, unspecified: Secondary | ICD-10-CM

## 2024-02-19 DIAGNOSIS — D1801 Hemangioma of skin and subcutaneous tissue: Secondary | ICD-10-CM

## 2024-02-19 NOTE — Patient Instructions (Signed)

## 2024-02-19 NOTE — Progress Notes (Signed)
 Follow-Up Visit   Subjective  Bridget Walters is a 37 y.o. female who presents for the following: Skin Cancer Screening and Full Body Skin Exam. Hx of dysplastic nevi.   Check spot in right ear and back of neck. Raised bumps.   The patient presents for Total-Body Skin Exam (TBSE) for skin cancer screening and mole check. The patient has spots, moles and lesions to be evaluated, some may be new or changing and the patient may have concern these could be cancer.    The following portions of the chart were reviewed this encounter and updated as appropriate: medications, allergies, medical history  Review of Systems:  No other skin or systemic complaints except as noted in HPI or Assessment and Plan.  Objective  Well appearing patient in no apparent distress; mood and affect are within normal limits.  A full examination was performed including scalp, head, eyes, ears, nose, lips, neck, chest, axillae, abdomen, back, buttocks, bilateral upper extremities, bilateral lower extremities, hands, feet, fingers, toes, fingernails, and toenails. All findings within normal limits unless otherwise noted below.   Relevant physical exam findings are noted in the Assessment and Plan.         Assessment & Plan   SKIN CANCER SCREENING PERFORMED TODAY.  HISTORY OF DYSPLASTIC NEVUS Left buttock moderate 02/2020 See history for other  No evidence of recurrence today Recommend regular full body skin exams Recommend daily broad spectrum sunscreen SPF 30+ to sun-exposed areas, reapply every 2 hours as needed.  Call if any new or changing lesions are noted between office visits    ACTINIC DAMAGE - Chronic condition, secondary to cumulative UV/sun exposure - diffuse scaly erythematous macules with underlying dyspigmentation - Recommend daily broad spectrum sunscreen SPF 30+ to sun-exposed areas, reapply every 2 hours as needed.  - Staying in the shade or wearing long sleeves, sun glasses (UVA+UVB  protection) and wide brim hats (4-inch brim around the entire circumference of the hat) are also recommended for sun protection.  - Call for new or changing lesions.  LENTIGINES, SEBORRHEIC KERATOSES, HEMANGIOMAS - Benign normal skin lesions - Benign-appearing - Call for any changes  MELANOCYTIC NEVI - Tan-brown and/or pink-flesh-colored symmetric macules and papules, photos compared  - Right buttock 6 mm med brown macule  - Left spinal mid back 2 mm med dark brown macule and adjacent 1 mm dark brown macule  - Right posterior upper arm 5 mm brown thin papule  - Left posterior thigh 4 mm med dark brown macule  - Right upper flank 5 x 4 mm speckled brown papule  - Right hip 5 mm brown macule, lighter edge  - Spinal upper back 2 mm flesh papule, discussed shave removal since irritated, pt defers  - Right lateral upper thigh- new when compared to prior photo -2 mm regular med brown papule, new photo today  - Benign-appearing. Stable compared to previous visit. Reviewed photos from 2023. Observation.  Call clinic for new or changing moles.  Recommend daily use of broad spectrum spf 30+ sunscreen to sun-exposed areas.    EPIDERMAL INCLUSION CYST Exam: indistinct firm papule inside right ear at tragus  Benign-appearing. Exam most consistent with an epidermal inclusion cyst. Discussed that a cyst is a benign growth that can grow over time and sometimes get irritated or inflamed. Recommend observation if it is not bothersome. Discussed option of surgical excision to remove it if it is growing, symptomatic, or other changes noted. Please call for new or changing lesions  so they can be evaluated.  Recommend using OTC BPO topical once or twice daily.  Neutrogena sample given.  May also try OTC 1% hydrocortisone cream 1-2 times daily to affected area until bump resolved     Return in about 1 year (around 02/18/2025) for TBSE, HxDN.  I, Jill Parcell, CMA, am acting as scribe for Artemio Larry, MD.   Documentation: I have reviewed the above documentation for accuracy and completeness, and I agree with the above.  Artemio Larry, MD

## 2025-03-15 ENCOUNTER — Encounter: Admitting: Dermatology
